# Patient Record
Sex: Female | Born: 1935 | Race: White | Hispanic: No | Smoking: Former smoker
Health system: Southern US, Community
[De-identification: ages and names within clinical notes are randomized; demographics above are authoritative.]

## PROBLEM LIST (undated history)

## (undated) DIAGNOSIS — I10 Essential (primary) hypertension: Secondary | ICD-10-CM

## (undated) DIAGNOSIS — G629 Polyneuropathy, unspecified: Secondary | ICD-10-CM

## (undated) DIAGNOSIS — M1990 Unspecified osteoarthritis, unspecified site: Secondary | ICD-10-CM

## (undated) DIAGNOSIS — E039 Hypothyroidism, unspecified: Secondary | ICD-10-CM

## (undated) DIAGNOSIS — E079 Disorder of thyroid, unspecified: Secondary | ICD-10-CM

## (undated) HISTORY — PX: OTHER SURGICAL HISTORY: SHX169

## (undated) HISTORY — PX: HIP SURGERY: SHX245

## (undated) HISTORY — PX: SHOULDER SURGERY: SHX246

## (undated) HISTORY — PX: WRIST SURGERY: SHX841

---

## 2020-04-01 ENCOUNTER — Emergency Department (HOSPITAL_BASED_OUTPATIENT_CLINIC_OR_DEPARTMENT_OTHER): Payer: Medicare Other

## 2020-04-01 ENCOUNTER — Other Ambulatory Visit: Payer: Self-pay

## 2020-04-01 ENCOUNTER — Inpatient Hospital Stay (HOSPITAL_BASED_OUTPATIENT_CLINIC_OR_DEPARTMENT_OTHER)
Admission: EM | Admit: 2020-04-01 | Discharge: 2020-04-05 | DRG: 439 | Disposition: A | Discharge status: Home Health | Payer: Medicare Other | Attending: Internal Medicine | Admitting: Internal Medicine

## 2020-04-01 ENCOUNTER — Encounter (HOSPITAL_BASED_OUTPATIENT_CLINIC_OR_DEPARTMENT_OTHER): Payer: Self-pay

## 2020-04-01 DIAGNOSIS — R4702 Dysphasia: Secondary | ICD-10-CM | POA: Diagnosis present

## 2020-04-01 DIAGNOSIS — G629 Polyneuropathy, unspecified: Secondary | ICD-10-CM | POA: Diagnosis present

## 2020-04-01 DIAGNOSIS — Z79899 Other long term (current) drug therapy: Secondary | ICD-10-CM

## 2020-04-01 DIAGNOSIS — D649 Anemia, unspecified: Secondary | ICD-10-CM | POA: Diagnosis present

## 2020-04-01 DIAGNOSIS — I160 Hypertensive urgency: Secondary | ICD-10-CM | POA: Diagnosis present

## 2020-04-01 DIAGNOSIS — R4701 Aphasia: Secondary | ICD-10-CM | POA: Diagnosis not present

## 2020-04-01 DIAGNOSIS — E871 Hypo-osmolality and hyponatremia: Secondary | ICD-10-CM | POA: Diagnosis present

## 2020-04-01 DIAGNOSIS — I739 Peripheral vascular disease, unspecified: Secondary | ICD-10-CM | POA: Diagnosis present

## 2020-04-01 DIAGNOSIS — R471 Dysarthria and anarthria: Secondary | ICD-10-CM | POA: Diagnosis not present

## 2020-04-01 DIAGNOSIS — E079 Disorder of thyroid, unspecified: Secondary | ICD-10-CM

## 2020-04-01 DIAGNOSIS — I440 Atrioventricular block, first degree: Secondary | ICD-10-CM | POA: Diagnosis present

## 2020-04-01 DIAGNOSIS — K810 Acute cholecystitis: Secondary | ICD-10-CM | POA: Diagnosis not present

## 2020-04-01 DIAGNOSIS — Z66 Do not resuscitate: Secondary | ICD-10-CM | POA: Diagnosis present

## 2020-04-01 DIAGNOSIS — R29703 NIHSS score 3: Secondary | ICD-10-CM | POA: Diagnosis present

## 2020-04-01 DIAGNOSIS — Z87891 Personal history of nicotine dependence: Secondary | ICD-10-CM

## 2020-04-01 DIAGNOSIS — K859 Acute pancreatitis without necrosis or infection, unspecified: Secondary | ICD-10-CM | POA: Diagnosis not present

## 2020-04-01 DIAGNOSIS — Z20822 Contact with and (suspected) exposure to covid-19: Secondary | ICD-10-CM | POA: Diagnosis present

## 2020-04-01 DIAGNOSIS — Z22322 Carrier or suspected carrier of Methicillin resistant Staphylococcus aureus: Secondary | ICD-10-CM | POA: Diagnosis not present

## 2020-04-01 DIAGNOSIS — H9190 Unspecified hearing loss, unspecified ear: Secondary | ICD-10-CM | POA: Diagnosis present

## 2020-04-01 DIAGNOSIS — I959 Hypotension, unspecified: Secondary | ICD-10-CM | POA: Diagnosis not present

## 2020-04-01 DIAGNOSIS — I10 Essential (primary) hypertension: Secondary | ICD-10-CM | POA: Diagnosis present

## 2020-04-01 DIAGNOSIS — R945 Abnormal results of liver function studies: Secondary | ICD-10-CM | POA: Diagnosis not present

## 2020-04-01 DIAGNOSIS — Z79890 Hormone replacement therapy (postmenopausal): Secondary | ICD-10-CM

## 2020-04-01 DIAGNOSIS — E039 Hypothyroidism, unspecified: Secondary | ICD-10-CM | POA: Diagnosis present

## 2020-04-01 DIAGNOSIS — E785 Hyperlipidemia, unspecified: Secondary | ICD-10-CM | POA: Diagnosis present

## 2020-04-01 DIAGNOSIS — I6389 Other cerebral infarction: Secondary | ICD-10-CM | POA: Diagnosis not present

## 2020-04-01 DIAGNOSIS — R7401 Elevation of levels of liver transaminase levels: Secondary | ICD-10-CM | POA: Diagnosis not present

## 2020-04-01 DIAGNOSIS — K851 Biliary acute pancreatitis without necrosis or infection: Secondary | ICD-10-CM | POA: Diagnosis present

## 2020-04-01 DIAGNOSIS — G459 Transient cerebral ischemic attack, unspecified: Secondary | ICD-10-CM | POA: Diagnosis not present

## 2020-04-01 DIAGNOSIS — I639 Cerebral infarction, unspecified: Secondary | ICD-10-CM | POA: Diagnosis not present

## 2020-04-01 DIAGNOSIS — R1011 Right upper quadrant pain: Secondary | ICD-10-CM

## 2020-04-01 HISTORY — DX: Unspecified osteoarthritis, unspecified site: M19.90

## 2020-04-01 HISTORY — DX: Essential (primary) hypertension: I10

## 2020-04-01 HISTORY — DX: Disorder of thyroid, unspecified: E07.9

## 2020-04-01 HISTORY — DX: Polyneuropathy, unspecified: G62.9

## 2020-04-01 LAB — CBC WITH DIFFERENTIAL/PLATELET
Abs Immature Granulocytes: 0.05 K/uL (ref 0.00–0.07)
Basophils Absolute: 0 K/uL (ref 0.0–0.1)
Basophils Relative: 0 %
Eosinophils Absolute: 0 K/uL (ref 0.0–0.5)
Eosinophils Relative: 0 %
HCT: 36.4 % (ref 36.0–46.0)
Hemoglobin: 11.6 g/dL — ABNORMAL LOW (ref 12.0–15.0)
Immature Granulocytes: 0 %
Lymphocytes Relative: 4 %
Lymphs Abs: 0.5 K/uL — ABNORMAL LOW (ref 0.7–4.0)
MCH: 21.7 pg — ABNORMAL LOW (ref 26.0–34.0)
MCHC: 31.9 g/dL (ref 30.0–36.0)
MCV: 68 fL — ABNORMAL LOW (ref 80.0–100.0)
Monocytes Absolute: 0.6 K/uL (ref 0.1–1.0)
Monocytes Relative: 5 %
Neutro Abs: 10.6 K/uL — ABNORMAL HIGH (ref 1.7–7.7)
Neutrophils Relative %: 91 %
Platelets: 242 K/uL (ref 150–400)
RBC: 5.35 MIL/uL — ABNORMAL HIGH (ref 3.87–5.11)
RDW: 14.6 % (ref 11.5–15.5)
Smear Review: NORMAL
WBC: 11.8 K/uL — ABNORMAL HIGH (ref 4.0–10.5)
nRBC: 0 % (ref 0.0–0.2)

## 2020-04-01 LAB — LIPASE, BLOOD: Lipase: 1575 U/L — ABNORMAL HIGH (ref 11–51)

## 2020-04-01 LAB — COMPREHENSIVE METABOLIC PANEL
ALT: 596 U/L — ABNORMAL HIGH (ref 0–44)
AST: 1342 U/L — ABNORMAL HIGH (ref 15–41)
Albumin: 3.8 g/dL (ref 3.5–5.0)
Alkaline Phosphatase: 188 U/L — ABNORMAL HIGH (ref 38–126)
Anion gap: 10 (ref 5–15)
BUN: 12 mg/dL (ref 8–23)
CO2: 21 mmol/L — ABNORMAL LOW (ref 22–32)
Calcium: 9 mg/dL (ref 8.9–10.3)
Chloride: 92 mmol/L — ABNORMAL LOW (ref 98–111)
Creatinine, Ser: 0.39 mg/dL — ABNORMAL LOW (ref 0.44–1.00)
GFR calc Af Amer: >60 mL/min (ref >60)
GFR calc non Af Amer: >60 mL/min (ref >60)
Glucose, Bld: 141 mg/dL — ABNORMAL HIGH (ref 70–99)
Potassium: 4.3 mmol/L (ref 3.5–5.1)
Sodium: 123 mmol/L — ABNORMAL LOW (ref 135–145)
Total Bilirubin: 1.1 mg/dL (ref 0.3–1.2)
Total Protein: 6.8 g/dL (ref 6.5–8.1)

## 2020-04-01 LAB — RESPIRATORY PANEL BY RT PCR (FLU A&B, COVID)
Influenza A by PCR: NEGATIVE
Influenza B by PCR: NEGATIVE
SARS Coronavirus 2 by RT PCR: NEGATIVE

## 2020-04-01 LAB — TROPONIN I (HIGH SENSITIVITY)
Troponin I (High Sensitivity): 6 ng/L (ref <18)
Troponin I (High Sensitivity): 7 ng/L

## 2020-04-01 MED ORDER — LACTATED RINGERS IV BOLUS
1000.0000 mL | Freq: Once | INTRAVENOUS | Status: AC
  Administered 2020-04-01: 1000 mL via INTRAVENOUS

## 2020-04-01 MED ORDER — FENTANYL CITRATE (PF) 100 MCG/2ML IJ SOLN
12.5000 ug | Freq: Once | INTRAMUSCULAR | Status: AC
  Administered 2020-04-01: 12.5 ug via INTRAVENOUS
  Filled 2020-04-01: qty 2

## 2020-04-01 MED ORDER — LIDOCAINE VISCOUS HCL 2 % MT SOLN: 15.0000 mL | Freq: Once | OROMUCOSAL | Status: DC

## 2020-04-01 MED ORDER — ALUM & MAG HYDROXIDE-SIMETH 200-200-20 MG/5ML PO SUSP: 30.0000 mL | Freq: Once | ORAL | Status: DC

## 2020-04-01 MED ORDER — ONDANSETRON HCL 4 MG/2ML IJ SOLN
4.0000 mg | Freq: Once | INTRAMUSCULAR | Status: AC
  Administered 2020-04-01: 4 mg via INTRAVENOUS
  Filled 2020-04-01: qty 2

## 2020-04-01 MED ORDER — FENTANYL CITRATE (PF) 100 MCG/2ML IJ SOLN
25.0000 ug | Freq: Once | INTRAMUSCULAR | Status: AC
  Administered 2020-04-01: 25 ug via INTRAVENOUS
  Filled 2020-04-01: qty 2

## 2020-04-01 MED ORDER — FENTANYL CITRATE (PF) 100 MCG/2ML IJ SOLN: 50.0000 ug | Freq: Once | INTRAMUSCULAR | Status: DC

## 2020-04-01 MED ORDER — IOHEXOL 300 MG/ML  SOLN
80.0000 mL | Freq: Once | INTRAMUSCULAR | Status: AC | PRN
  Administered 2020-04-01: 80 mL via INTRAVENOUS

## 2020-04-01 MED ORDER — SODIUM CHLORIDE 0.9 % IV SOLN
1.0000 g | Freq: Once | INTRAVENOUS | Status: AC
  Administered 2020-04-01: 1 g via INTRAVENOUS
  Filled 2020-04-01: qty 10

## 2020-04-01 MED ORDER — METRONIDAZOLE IN NACL 5-0.79 MG/ML-% IV SOLN
500.0000 mg | Freq: Once | INTRAVENOUS | Status: AC
  Administered 2020-04-01: 500 mg via INTRAVENOUS
  Filled 2020-04-01: qty 100

## 2020-04-01 NOTE — ED Notes (Signed)
Dr. Plunkett ED Provider at bedside. 

## 2020-04-01 NOTE — ED Provider Notes (Signed)
[facility] EMERGENCY DEPARTMENT Provider Note   CSN: [id number] Arrival date & time: 04/01/20  1845     History Chief Complaint  Patient presents with  . Chest Pain    Meghan Kim is a 86 y.o. female.  Patient is a 86 year old female with a history of hypertension, thyroid disease who is presenting today with chest and epigastric pain.  Patient reports she had lunch around noon and shortly after lunch she started experiencing discomfort in her chest.  She tried to take some Pepto-Bismol but it did not help.  Prior to coming to the emergency room she did have some nausea but no vomiting.  She reports the pain has now moved into her upper abdomen.  She has no shortness of breath, diarrhea or urinary complaints.  She has had no fever or cough.  No prior history of cardiac issues except for hypertension.  She has had no prior abdominal surgeries.  She reports the pain is a 7 out of 10.  It does not radiate.  The history is provided by the patient.  Chest Pain Pain location:  Substernal area and epigastric Pain quality: aching, dull and throbbing   Pain radiates to:  Does not radiate Pain severity:  Severe Onset quality:  Gradual Duration:  7 hours Timing:  Constant Progression:  Worsening Chronicity:  New Context comment:  Started 30 to 45 minutes after eating lunch Relieved by:  Nothing Worsened by:  Nothing Ineffective treatments:  Antacids Associated symptoms: abdominal pain and nausea   Associated symptoms: no cough, no fever, no heartburn, no palpitations and no vomiting        Past Medical History:  Diagnosis Date  . Arthritis   . Hypertension   . Neuropathy   . Thyroid disease     There are no problems to display for this patient.   Past Surgical History:  Procedure Laterality Date  . arm surgery    . HIP SURGERY    . SHOULDER SURGERY    . WRIST SURGERY       OB History   No obstetric history on file.     No family history on  file.  Social History   Tobacco Use  . Smoking status: Former Research scientist (life sciences)  . Smokeless tobacco: Never Used  Substance Use Topics  . Alcohol use: Yes    Comment: occ  . Drug use: Never    Home Medications Prior to Admission medications   Medication Sig Start Date End Date Taking? Authorizing Provider  alendronate (FOSAMAX) 70 MG tablet Take by mouth. 01/12/20  Yes [provider]  dupilumab (DUPIXENT) 300 MG/2ML prefilled syringe Inject into the skin. 02/05/20  Yes [provider]  fluticasone (FLONASE) 50 MCG/ACT nasal spray USE 2 SPRAYS IN EACH NOSTRIL TWICE DAILY 12/15/19  Yes [provider]  furosemide (LASIX) 20 MG tablet Take by mouth. 11/11/18  Yes [provider]  gabapentin (NEURONTIN) 300 MG capsule Take by mouth. 02/08/20 04/08/20 Yes [provider]  levothyroxine (SYNTHROID) 50 MCG tablet TAKE 1 TABLET BY MOUTH EVERY DAY AT 6:00AM 07/08/17  Yes [provider]  mirabegron ER (MYRBETRIQ) 25 MG TB24 tablet Take by mouth. 03/20/20  Yes [provider]  rOPINIRole (REQUIP) 0.25 MG tablet Take by mouth. 02/15/20  Yes [provider]  temazepam (RESTORIL) 15 MG capsule Take by mouth. 11/11/19  Yes [provider]  valsartan (DIOVAN) 80 MG tablet Take by mouth. 03/20/20  Yes [provider]  fexofenadine Delma Freeze)  180 MG tablet Take by mouth.    [provider]  lisinopril (ZESTRIL) 10 MG tablet Take 10 mg by mouth daily. 11/02/19   [provider]  omeprazole (PRILOSEC) 20 MG capsule Take by mouth.    [provider]  STOOL SOFTENER 100 MG capsule Take 100 mg by mouth daily. 03/09/20   [provider]    Allergies    Patient has no known allergies.  Review of Systems   Review of Systems  Constitutional: Negative for fever.  Respiratory: Negative for cough.   Cardiovascular: Positive for chest pain. Negative for palpitations.  Gastrointestinal: Positive for  abdominal pain and nausea. Negative for heartburn and vomiting.    Physical Exam Updated Vital Signs Ht 5' 2"  (1.575 m)   Wt 54.4 kg   BMI 21.95 kg/m   Physical Exam Vitals and nursing note reviewed.  Constitutional:      General: She is not in acute distress.    Appearance: She is well-developed and normal weight.  HENT:     Head: Normocephalic and atraumatic.  Eyes:     Pupils: Pupils are equal, round, and reactive to light.  Cardiovascular:     Rate and Rhythm: Regular rhythm. Tachycardia present.     Heart sounds: Normal heart sounds. No murmur. No friction rub.  Pulmonary:     Effort: Pulmonary effort is normal.     Breath sounds: Normal breath sounds. No wheezing or rales.  Abdominal:     General: Bowel sounds are normal. There is no distension.     Palpations: Abdomen is soft.     Tenderness: There is abdominal tenderness in the right upper quadrant. There is guarding. There is no rebound. Positive signs include Murphy's sign.  Musculoskeletal:        General: No tenderness. Normal range of motion.     Comments: No edema  Skin:    General: Skin is warm and dry.     Findings: No rash.  Neurological:     General: No focal deficit present.     Mental Status: She is alert and oriented to person, place, and time. Mental status is at baseline.     Cranial Nerves: No cranial nerve deficit.  Psychiatric:        Mood and Affect: Mood normal.        Behavior: Behavior normal.        Thought Content: Thought content normal.     ED Results / Procedures / Treatments   Labs (all labs ordered are listed, but only abnormal results are displayed) Labs Reviewed  CBC WITH DIFFERENTIAL/PLATELET - Abnormal; Notable for the following components:      Result Value   WBC 11.8 (*)    RBC 5.35 (*)    Hemoglobin 11.6 (*)    MCV 68.0 (*)    MCH 21.7 (*)    Neutro Abs 10.6 (*)    Lymphs Abs 0.5 (*)    All other components within normal limits  COMPREHENSIVE METABOLIC PANEL -  Abnormal; Notable for the following components:   Sodium 123 (*)    Chloride 92 (*)    CO2 21 (*)    Glucose, Bld 141 (*)    Creatinine, Ser 0.39 (*)    AST 1,342 (*)    ALT 596 (*)    Alkaline Phosphatase 188 (*)    All other components within normal limits  LIPASE, BLOOD - Abnormal; Notable for the following components:   Lipase 1,575 (*)  All other components within normal limits  RESPIRATORY PANEL BY RT PCR (FLU A&B, COVID)  TROPONIN I (HIGH SENSITIVITY)  TROPONIN I (HIGH SENSITIVITY)    EKG EKG Interpretation  Date/Time:  Saturday Apr 01 2020 18:57:30 EDT Ventricular Rate:  107 PR Interval:    QRS Duration: 76 QT Interval:  315 QTC Calculation: 421 R Axis:   -52 Text Interpretation: Sinus tachycardia Prolonged PR interval Inferior infarct, old Anterior infarct, old No previous tracing Confirmed by Blanchie Dessert [phone]) on 04/01/2020 7:17:04 PM   Radiology CT ABDOMEN PELVIS W CONTRAST  Result Date: 04/01/2020 CLINICAL DATA:  Acute abdominal pain. EXAM: CT ABDOMEN AND PELVIS WITH CONTRAST TECHNIQUE: Multidetector CT imaging of the abdomen and pelvis was performed using the standard protocol following bolus administration of intravenous contrast. CONTRAST:  63m OMNIPAQUE IOHEXOL 300 MG/ML  SOLN COMPARISON:  February 11, 2017. FINDINGS: Lower chest: The lung bases are clear. The heart size is normal. Hepatobiliary: The liver is normal. The gallbladder is significantly distended. There are pericholecystic inflammatory changes.There is significant intrahepatic and extrahepatic biliary ductal dilatation. The common bile duct measures up to approximately 1.4 cm in diameter. Pancreas: The pancreas is atrophic. There are mild inflammatory changes near the head of the pancreas. Spleen: Unremarkable. Adrenals/Urinary Tract: --Adrenal glands: Unremarkable. --Right kidney/ureter: No hydronephrosis or radiopaque kidney stones. --Left kidney/ureter: No hydronephrosis or radiopaque kidney  stones. --Urinary bladder: The urinary bladder is significantly distended. Stomach/Bowel: --Stomach/Duodenum: No hiatal hernia or other gastric abnormality. Normal duodenal course and caliber. --Small bowel: There are inflammatory changes surrounding the third and fourth portions of the duodenum. Duodenal diverticula are noted. --Colon: Rectosigmoid diverticulosis without acute inflammation. --Appendix: Normal. Vascular/Lymphatic: Atherosclerotic calcification is present within the non-aneurysmal abdominal aorta, without hemodynamically significant stenosis. Heavy calcifications are noted in the mid to distal aorta likely resulting in some mild to moderate stenosis. --No retroperitoneal lymphadenopathy. --No mesenteric lymphadenopathy. --No pelvic or inguinal lymphadenopathy. Reproductive: There prominent pelvic veins bilaterally. Other: No ascites or free air. The abdominal wall is normal. Musculoskeletal. There is an age-indeterminate compression fracture of the L4 vertebral body, new since 2018. Multilevel degenerative changes are noted throughout the lumbar spine. There is an old fracture of the inferior pubic ramus on the right with evidence for nonunion. There are old fractures involving the right superior pubic ramus and anterior column of the right acetabulum. Bilateral sacral fractures are noted status post bilateral sacral fixation. The patient is status post prior intramedullary nail placement through the left femur. There are end-stage degenerative changes of the left hip. There are moderate degenerative changes of the right hip. IMPRESSION: 1. Significantly distended gallbladder with pericholecystic inflammatory changes and significant intrahepatic and extrahepatic biliary ductal dilatation. Findings are concerning for acute cholecystitis or choledocholithiasis in the appropriate clinical setting. Consider further evaluation with ultrasound and MRCP as clinically indicated. 2. Inflammatory changes  surrounding the head of the pancreas and duodenum. Findings may be secondary to acute interstitial pancreatitis or duodenitis. Correlation with laboratory studies is recommended. 3. Rectosigmoid diverticulosis without acute inflammation. 4. Age-indeterminate compression fracture of the L4 vertebral body, new since 2018. 5. Old pelvic fractures as above. Aortic Atherosclerosis (ICD10-I70.0). Electronically Signed   By: CConstance HolsterM.D.   On: 04/01/2020 22:50   DG Chest Port 1 View  Result Date: 04/01/2020 CLINICAL DATA:  Chest discomfort since lunch, previous tobacco abuse EXAM: PORTABLE CHEST 1 VIEW COMPARISON:  08/19/2018 FINDINGS: Single frontal view of the chest demonstrates an unremarkable cardiac silhouette. Background interstitial prominence consistent with  history of tobacco abuse. No airspace disease, effusion, or pneumothorax. No acute bony abnormalities. IMPRESSION: 1. No acute intrathoracic process. Electronically Signed   By: Randa Ngo M.D.   On: 04/01/2020 20:04   US Abdomen Limited RUQ  Result Date: 04/01/2020 CLINICAL DATA:  Postprandial abdominal pain. EXAM: ULTRASOUND ABDOMEN LIMITED RIGHT UPPER QUADRANT COMPARISON:  None. FINDINGS: Gallbladder: No gallstones or wall thickening visualized (2.2 mm). No sonographic Murphy sign noted by sonographer. Common bile duct: Diameter: 6.5 mm Liver: No focal lesion identified. Within normal limits in parenchymal echogenicity. Portal vein is patent on color Doppler imaging with normal direction of blood flow towards the liver. Other: It should be noted that the study is limited secondary to overlying bowel gas. IMPRESSION: 1. Limited study secondary to overlying bowel gas. 2. Otherwise unremarkable right upper quadrant ultrasound. Electronically Signed   By: Virgina Norfolk M.D.   On: 04/01/2020 21:01    Procedures Procedures (including critical care time)  Medications Ordered in ED Medications  ondansetron (ZOFRAN) injection 4 mg  (has no administration in time range)  fentaNYL (SUBLIMAZE) injection 25 mcg (has no administration in time range)    ED Course  I have reviewed the triage vital signs and the nursing notes.  Pertinent labs & imaging results that were available during my care of the patient were reviewed by me and considered in my medical decision making (see chart for details).    MDM Rules/Calculators/A&P                      Elderly female presenting today with sternal and upper abdominal pain.  Patient reports symptoms started after noon today after she ate lunch.  She has had some burping and nausea but denies any cough or shortness of breath.  No respiratory symptoms, EKG without acute findings.  Possibility for ACS but low suspicion for pneumonia or PE.  High concern for cholecystitis as patient has significant right upper quadrant tenderness versus perforated viscus or other abdominal pathology such as acute hepatitis or pancreatitis.  Patient given pain control, chest x-ray, right upper quadrant ultrasound and labs are pending.  Patient is hypertensive here and satting normally on room air.  Hypertension may be related to pain and will reevaluate after patient's pain has improved.  10:53 PM Patient's LFTs are elevated today with an AST of 1300 and ALT of 596 with normal T bili and elevated alk phos of 188.  Lipase is 1500 today, troponins are negative and CBC with minimal leukocytosis of 11,000.  Patient's right upper quadrant ultrasound is limited secondary to overlying bowel gas but other wise report an unremarkable right upper quadrant.  Chest x-ray without acute findings.  Spoke with [facility] who recommended doing a CT for further evaluation.  On repeat evaluation patient reports that her pain initially was very controlled but is now starting to come back.  CT showed significantly distended gallbladder with pericholecystic inflammatory changes and significant intrahepatic and extrahepatic biliary ductal  dilation.  Findings were consistent for cholecystitis or choledocholithiasis despite right upper quadrant ultrasound being read as normal.  There were also inflammatory changes surrounding the head of the pancreas and duodenum.  They report patient may need an MRCP.  MDM Number of Diagnoses or Management Options Acute pancreatitis, unspecified complication status, unspecified pancreatitis type: new, needed workup RUQ pain: new, needed workup Transaminitis: new, needed workup   Amount and/or Complexity of Data Reviewed Clinical lab tests: ordered and reviewed Tests in the radiology  section of CPT: reviewed and ordered Tests in the medicine section of CPT: ordered and reviewed Decide to obtain previous medical records or to obtain history from someone other than the patient: yes Obtain history from someone other than the patient: no Review and summarize past medical records: yes Discuss the patient with other providers: yes Independent visualization of images, tracings, or specimens: yes  Risk of Complications, Morbidity, and/or Mortality Presenting problems: high Diagnostic procedures: low Management options: low  Patient Progress Patient progress: improved    Final Clinical Impression(s) / ED Diagnoses Final diagnoses:  RUQ pain  Acute pancreatitis, unspecified complication status, unspecified pancreatitis type  Transaminitis    Rx / DC Orders ED Discharge Orders    None       Blanchie Dessert, MD 04/01/20 2257

## 2020-04-01 NOTE — ED Notes (Addendum)
[facility] notified - patient ready for transport Meghan Kim)

## 2020-04-01 NOTE — ED Triage Notes (Signed)
Pt c/o CP that started just after lunch. Pt describes pain as achy. Pt denies ShOB or nausea. Pt states she thinks it "feels like indigestion." Pt state she was burping.

## 2020-04-02 ENCOUNTER — Encounter (HOSPITAL_COMMUNITY): Payer: Self-pay | Admitting: Family Medicine

## 2020-04-02 ENCOUNTER — Inpatient Hospital Stay (HOSPITAL_COMMUNITY): Payer: Medicare Other

## 2020-04-02 DIAGNOSIS — E871 Hypo-osmolality and hyponatremia: Secondary | ICD-10-CM | POA: Diagnosis present

## 2020-04-02 DIAGNOSIS — K859 Acute pancreatitis without necrosis or infection, unspecified: Secondary | ICD-10-CM

## 2020-04-02 DIAGNOSIS — K810 Acute cholecystitis: Secondary | ICD-10-CM

## 2020-04-02 DIAGNOSIS — I10 Essential (primary) hypertension: Secondary | ICD-10-CM | POA: Diagnosis present

## 2020-04-02 DIAGNOSIS — E079 Disorder of thyroid, unspecified: Secondary | ICD-10-CM | POA: Diagnosis present

## 2020-04-02 DIAGNOSIS — I639 Cerebral infarction, unspecified: Secondary | ICD-10-CM

## 2020-04-02 DIAGNOSIS — R7401 Elevation of levels of liver transaminase levels: Secondary | ICD-10-CM

## 2020-04-02 LAB — CBC WITH DIFFERENTIAL/PLATELET
Abs Immature Granulocytes: 0.04 10*3/uL (ref 0.00–0.07)
Basophils Absolute: 0 10*3/uL (ref 0.0–0.1)
Basophils Relative: 0 %
Eosinophils Absolute: 0.1 10*3/uL (ref 0.0–0.5)
Eosinophils Relative: 1 %
HCT: 34.5 % — ABNORMAL LOW (ref 36.0–46.0)
Hemoglobin: 11.1 g/dL — ABNORMAL LOW (ref 12.0–15.0)
Immature Granulocytes: 0 %
Lymphocytes Relative: 7 %
Lymphs Abs: 0.7 10*3/uL (ref 0.7–4.0)
MCH: 22 pg — ABNORMAL LOW (ref 26.0–34.0)
MCHC: 32.2 g/dL (ref 30.0–36.0)
MCV: 68.3 fL — ABNORMAL LOW (ref 80.0–100.0)
Monocytes Absolute: 0.8 10*3/uL (ref 0.1–1.0)
Monocytes Relative: 8 %
Neutro Abs: 8.9 10*3/uL — ABNORMAL HIGH (ref 1.7–7.7)
Neutrophils Relative %: 84 %
Platelets: 225 10*3/uL (ref 150–400)
RBC: 5.05 MIL/uL (ref 3.87–5.11)
RDW: 14.5 % (ref 11.5–15.5)
WBC: 10.5 10*3/uL (ref 4.0–10.5)
nRBC: 0 % (ref 0.0–0.2)

## 2020-04-02 LAB — COMPREHENSIVE METABOLIC PANEL
ALT: 565 U/L — ABNORMAL HIGH (ref 0–44)
AST: 736 U/L — ABNORMAL HIGH (ref 15–41)
Albumin: 3.2 g/dL — ABNORMAL LOW (ref 3.5–5.0)
Alkaline Phosphatase: 160 U/L — ABNORMAL HIGH (ref 38–126)
Anion gap: 10 (ref 5–15)
BUN: 7 mg/dL — ABNORMAL LOW (ref 8–23)
CO2: 23 mmol/L (ref 22–32)
Calcium: 8.8 mg/dL — ABNORMAL LOW (ref 8.9–10.3)
Chloride: 96 mmol/L — ABNORMAL LOW (ref 98–111)
Creatinine, Ser: 0.42 mg/dL — ABNORMAL LOW (ref 0.44–1.00)
GFR calc Af Amer: >60 mL/min (ref >60)
GFR calc non Af Amer: >60 mL/min (ref >60)
Glucose, Bld: 110 mg/dL — ABNORMAL HIGH (ref 70–99)
Potassium: 4.2 mmol/L (ref 3.5–5.1)
Sodium: 129 mmol/L — ABNORMAL LOW (ref 135–145)
Total Bilirubin: 1.1 mg/dL (ref 0.3–1.2)
Total Protein: 6 g/dL — ABNORMAL LOW (ref 6.5–8.1)

## 2020-04-02 LAB — LIPASE, BLOOD: Lipase: 896 U/L — ABNORMAL HIGH (ref 11–51)

## 2020-04-02 LAB — MRSA PCR SCREENING: MRSA by PCR: POSITIVE — AB

## 2020-04-02 LAB — GLUCOSE, CAPILLARY: Glucose-Capillary: 83 mg/dL (ref 70–99)

## 2020-04-02 MED ORDER — GADOBUTROL 1 MMOL/ML IV SOLN
5.5000 mL | Freq: Once | INTRAVENOUS | Status: AC | PRN
  Administered 2020-04-02: 5.5 mL via INTRAVENOUS

## 2020-04-02 MED ORDER — ALTEPLASE (STROKE) FULL DOSE INFUSION
0.9000 mg/kg | Freq: Once | INTRAVENOUS | Status: AC
  Administered 2020-04-02: 48.1 mg via INTRAVENOUS
  Filled 2020-04-02: qty 100

## 2020-04-02 MED ORDER — SODIUM CHLORIDE 0.9 % IV SOLN: INTRAVENOUS | Status: AC

## 2020-04-02 MED ORDER — ACETAMINOPHEN 325 MG PO TABS
650.0000 mg | ORAL_TABLET | Freq: Four times a day (QID) | ORAL | Status: DC | PRN
  Administered 2020-04-02 – 2020-04-04 (×6): 650 mg via ORAL
  Filled 2020-04-02 (×6): qty 2

## 2020-04-02 MED ORDER — FENTANYL CITRATE (PF) 100 MCG/2ML IJ SOLN: 12.5000 ug | INTRAMUSCULAR | Status: DC | PRN

## 2020-04-02 MED ORDER — CLEVIDIPINE BUTYRATE 0.5 MG/ML IV EMUL
0.0000 mg/h | INTRAVENOUS | Status: DC
  Administered 2020-04-02: 1 mg/h via INTRAVENOUS
  Filled 2020-04-02: qty 50

## 2020-04-02 MED ORDER — ONDANSETRON HCL 4 MG/2ML IJ SOLN
4.0000 mg | Freq: Four times a day (QID) | INTRAMUSCULAR | Status: DC | PRN
  Administered 2020-04-02: 4 mg via INTRAVENOUS
  Filled 2020-04-02: qty 2

## 2020-04-02 MED ORDER — SODIUM CHLORIDE 0.9% FLUSH
3.0000 mL | Freq: Two times a day (BID) | INTRAVENOUS | Status: DC
  Administered 2020-04-02 – 2020-04-05 (×7): 3 mL via INTRAVENOUS

## 2020-04-02 MED ORDER — FENTANYL CITRATE (PF) 100 MCG/2ML IJ SOLN
INTRAMUSCULAR | Status: AC
  Administered 2020-04-02: 25 ug via INTRAVENOUS
  Filled 2020-04-02: qty 2

## 2020-04-02 MED ORDER — CHLORHEXIDINE GLUCONATE CLOTH 2 % EX PADS
6.0000 | MEDICATED_PAD | Freq: Every day | CUTANEOUS | Status: DC
  Administered 2020-04-02: 6 via TOPICAL

## 2020-04-02 MED ORDER — ACETAMINOPHEN 650 MG RE SUPP: 650.0000 mg | Freq: Four times a day (QID) | RECTAL | Status: DC | PRN

## 2020-04-02 MED ORDER — PIPERACILLIN-TAZOBACTAM 3.375 G IVPB
3.3750 g | Freq: Three times a day (TID) | INTRAVENOUS | Status: DC
  Administered 2020-04-02 – 2020-04-03 (×4): 3.375 g via INTRAVENOUS
  Filled 2020-04-02 (×5): qty 50

## 2020-04-02 MED ORDER — SODIUM CHLORIDE 0.9 % IV SOLN
50.0000 mL | Freq: Once | INTRAVENOUS | Status: AC
  Administered 2020-04-02: 50 mL via INTRAVENOUS

## 2020-04-02 MED ORDER — FENTANYL CITRATE (PF) 100 MCG/2ML IJ SOLN: 25.0000 ug | Freq: Once | INTRAMUSCULAR | Status: AC

## 2020-04-02 MED ORDER — LABETALOL HCL 5 MG/ML IV SOLN: 10.0000 mg | INTRAVENOUS | Status: DC | PRN

## 2020-04-02 NOTE — Significant Event (Signed)
Rapid Response Event Note  Overview: Arrival Time: 0945 Event Type: Neurologic New onset "word finding" and right facial droop.  Initial Focused Assessment: Pt lying in bed, alert. PERRLA, EOMI. Face is symmetrical, no droop upon my assessment. Pt is able to tell me her age, but not the month. Pt is HOH and hearing aids are not charged- creating some challenge to exam. Pt follows commands, but takes multiple prompts- likely due to hearing. Pt is able to identify some objects correctly but states "umm" repeatedly and gets frustrated when she is unable to identify a phone. Speech sounds mildly slurred, heavy. Pt tends to the left side more readily, but does cross midline and uses both sides when prompted. NIH 4. Code stroke activated for expressive aphasia.   Interventions: -LKW: 0730 -Code stroke called at [phone] -Verbal consent received from patient's son via telephone for tPA administration by myself and Dr. Otelia Limes -tPA started at 1043 - 5.4mg  bolus over 1 minute followed by 48.1mg  for a total dose of 53.5mg  over 1 hour, administration verified by Joaquim Lai., RN.   Plan of Care (if not transferred): -Transfer to Neuro ICU for tPA infusion  Event Summary: Name of Physician Notified: Dr. Jonathon Bellows at [phone] Outcome: Transferred (Comment)(Transferred to 4N ICU) Event End Time: 1115  Jennye Moccasin

## 2020-04-02 NOTE — Progress Notes (Signed)
RN entered pt room to perform mNIHSS exam with pt. Pt states she would not like to participate at this time, pt states "not while I'm eating". RN discussed the need to continue to perform serial mNIHSS exams and that the timing of the exam is critical to provide adequate care. Pt is dismissive of RN and adamantly refuses to participate at this time. RN will leave pt room and request for assistance from another RN on the unit.

## 2020-04-02 NOTE — Progress Notes (Signed)
Pt complains of extreme hunger. Pt has passed [employer] Screen and would like a meal tray to be brought to her room immediately.Pt denies feeling nauseous. RN will facilitate a meal order to be placed at this time.

## 2020-04-02 NOTE — H&P (Addendum)
History and Physical    Dariel Pellecchia OEU:[id number] DOB: 1926-02-25 DOA: 04/01/2020  PCP: [facility]   Patient coming from: Home  Chief Complaint: Chest/upper abdominal pain, nausea  HPI: Meghan Kim is a 86 y.o. female with medical history significant for hypertension, hypothyroidism, arthritis, and neuropathy, now presenting to the emergency department with acute onset of pain in her lower chest and upper abdomen along with nausea.  Patient reports that she typically enjoys good health and was in her usual state until right after eating lunch when she developed discomfort in her chest which she describes as indigestion.  She did not experience any relief with Pepto-Bismol and the discomfort became more intense.  Pain began to localize more to the upper abdomen and she was experiencing nausea but no vomiting.  She did not notice any fevers or chills.  She has never experienced this previously.  Denies any cough or shortness of breath.  [facility] ED Course: Upon arrival to the ED, patient is found to be slightly tachycardic and hypertensive to 200/90.  EKG features sinus tachycardia with rate 107 and first-degree AV nodal block.  Chest x-ray is negative for acute cardiopulmonary disease.  Troponin is normal x2.  Chemistry panel notable for sodium 123, AST 1342, ALT 596, and lipase 1575.  CBC with leukocytosis to 18,800.  Covid and influenza PCR were negative.  Right upper quadrant ultrasound with CBD diameter of 6.5 and otherwise unremarkable though limited by overlying bowel gas.  CT the abdomen pelvis reveals significantly distended gallbladder with pericholecystic inflammatory changes and significant intra and extrahepatic biliary ductal dilatation concerning for acute cholecystitis or choledocholithiasis.  Also noted on CT is inflammatory changes about the head of the pancreas.  Patient was given a liter of lactated Ringer's, Zofran, Rocephin, Flagyl, and multiple doses of IV  fentanyl in the ED.  Review of Systems:  All other systems reviewed and apart from HPI, are negative.  Past Medical History:  Diagnosis Date  . Arthritis   . Hypertension   . Neuropathy   . Thyroid disease     Past Surgical History:  Procedure Laterality Date  . arm surgery    . HIP SURGERY    . SHOULDER SURGERY    . WRIST SURGERY       reports that she has quit smoking. She has never used smokeless tobacco. She reports current alcohol use. She reports that she does not use drugs.  No Known Allergies  History reviewed. No pertinent family history.   Prior to Admission medications   Medication Sig Start Date End Date Taking? Authorizing Provider  alendronate (FOSAMAX) 70 MG tablet Take by mouth. 01/12/20  Yes [provider]  dupilumab (DUPIXENT) 300 MG/2ML prefilled syringe Inject into the skin. 02/05/20  Yes [provider]  fluticasone (FLONASE) 50 MCG/ACT nasal spray USE 2 SPRAYS IN EACH NOSTRIL TWICE DAILY 12/15/19  Yes [provider]  furosemide (LASIX) 20 MG tablet Take by mouth. 11/11/18  Yes [provider]  gabapentin (NEURONTIN) 300 MG capsule Take by mouth. 02/08/20 04/08/20 Yes [provider]  levothyroxine (SYNTHROID) 50 MCG tablet TAKE 1 TABLET BY MOUTH EVERY DAY AT 6:00AM 07/08/17  Yes [provider]  mirabegron ER (MYRBETRIQ) 25 MG TB24 tablet Take by mouth. 03/20/20  Yes [provider]  rOPINIRole (REQUIP) 0.25 MG tablet Take by mouth. 02/15/20  Yes [provider]  temazepam (RESTORIL) 15 MG capsule Take by mouth. 11/11/19  Yes [provider]  valsartan (  DIOVAN) 80 MG tablet Take by mouth. 03/20/20  Yes [provider]  fexofenadine (ALLEGRA) 180 MG tablet Take by mouth.    [provider]  lisinopril (ZESTRIL) 10 MG tablet Take 10 mg by mouth daily. 11/02/19   [provider]  omeprazole (PRILOSEC) 20 MG capsule Take by mouth.    [provider]    STOOL SOFTENER 100 MG capsule Take 100 mg by mouth daily. 03/09/20   [provider]    Physical Exam: Vitals:   04/02/20 0012 04/02/20 0047 04/02/20 0100 04/02/20 0227  BP:  (!) 166/76 (!) 159/71 (!) 156/73  Pulse: (!) 101 100 98 92  Resp: 17 (!) 24 16 20   Temp:    99.3 F (37.4 C)  SpO2: 95% 98% 94% 100%  Weight:      Height:        Constitutional: NAD, calm  Eyes: PERTLA, lids and conjunctivae normal ENMT: Mucous membranes are moist. Posterior pharynx clear of any exudate or lesions.   Neck: normal, supple, no masses, no thyromegaly Respiratory:  no wheezing, no crackles. No accessory muscle use.  Cardiovascular: S1 & S2 heard, regular rate and rhythm. No extremity edema.   Abdomen: Tender in epigastrium without rebound pain or guarding. Bowel sounds active.  Musculoskeletal: no clubbing / cyanosis. No joint deformity upper and lower extremities.   Skin: no significant rashes, lesions, ulcers. Warm, dry, well-perfused. Neurologic: No facial asymmetry, gross hearing deficit. Sensation intact. Strength 5/5 in all 4 limbs.  Psychiatric: Alert and oriented to person, place, and situation. Very pleasant and cooperative.    Labs and Imaging on Admission: I have personally reviewed following labs and imaging studies  CBC: Recent Labs  Lab 04/01/20 1923  WBC 11.8*  NEUTROABS 10.6*  HGB 11.6*  HCT 36.4  MCV 68.0*  PLT 315   Basic Metabolic Panel: Recent Labs  Lab 04/01/20 1923  NA 123*  K 4.3  CL 92*  CO2 21*  GLUCOSE 141*  BUN 12  CREATININE 0.39*  CALCIUM 9.0   GFR: Estimated Creatinine Clearance: 36.2 mL/min (A) (by C-G formula based on SCr of 0.39 mg/dL (L)). Liver Function Tests: Recent Labs  Lab 04/01/20 1923  AST 1,342*  ALT 596*  ALKPHOS 188*  BILITOT 1.1  PROT 6.8  ALBUMIN 3.8   Recent Labs  Lab 04/01/20 1923  LIPASE 1,575*   No results for input(s): AMMONIA in the last 168 hours. Coagulation Profile: No results for input(s):  INR, PROTIME in the last 168 hours. Cardiac Enzymes: No results for input(s): CKTOTAL, CKMB, CKMBINDEX, TROPONINI in the last 168 hours. BNP (last 3 results) No results for input(s): PROBNP in the last 8760 hours. HbA1C: No results for input(s): HGBA1C in the last 72 hours. CBG: No results for input(s): GLUCAP in the last 168 hours. Lipid Profile: No results for input(s): CHOL, HDL, LDLCALC, TRIG, CHOLHDL, LDLDIRECT in the last 72 hours. Thyroid Function Tests: No results for input(s): TSH, T4TOTAL, FREET4, T3FREE, THYROIDAB in the last 72 hours. Anemia Panel: No results for input(s): VITAMINB12, FOLATE, FERRITIN, TIBC, IRON, RETICCTPCT in the last 72 hours. Urine analysis: No results found for: COLORURINE, APPEARANCEUR, LABSPEC, PHURINE, GLUCOSEU, HGBUR, BILIRUBINUR, KETONESUR, PROTEINUR, UROBILINOGEN, NITRITE, LEUKOCYTESUR Sepsis Labs: @LABRCNTIP (procalcitonin:4,lacticidven:4) ) Recent Results (from the past 240 hour(s))  Respiratory Panel by RT PCR (Flu A&B, Covid) - Nasopharyngeal Swab     Status: None   Collection Time: 04/01/20 10:08 PM   Specimen: Nasopharyngeal Swab  Result Value Ref Range Status  SARS Coronavirus 2 by RT PCR NEGATIVE NEGATIVE Final    Comment: (NOTE) SARS-CoV-2 target nucleic acids are NOT DETECTED. The SARS-CoV-2 RNA is generally detectable in upper respiratoy specimens during the acute phase of infection. The lowest concentration of SARS-CoV-2 viral copies this assay can detect is 131 copies/mL. A negative result does not preclude SARS-Cov-2 infection and should not be used as the sole basis for treatment or other patient management decisions. A negative result may occur with  improper specimen collection/handling, submission of specimen other than nasopharyngeal swab, presence of viral mutation(s) within the areas targeted by this assay, and inadequate number of viral copies (<131 copies/mL). A negative result must be combined with  clinical observations, patient history, and epidemiological information. The expected result is Negative. Fact Sheet for Patients:  PinkCheek.be Fact Sheet for Healthcare Providers:  GravelBags.it This test is not yet ap proved or cleared by the [place] FDA and  has been authorized for detection and/or diagnosis of SARS-CoV-2 by FDA under an Emergency Use Authorization (EUA). This EUA will remain  in effect (meaning this test can be used) for the duration of the COVID-19 declaration under Section 564(b)(1) of the Act, 21 U.S.C. section 360bbb-3(b)(1), unless the authorization is terminated or revoked sooner.    Influenza A by PCR NEGATIVE NEGATIVE Final   Influenza B by PCR NEGATIVE NEGATIVE Final    Comment: (NOTE) The Xpert Xpress SARS-CoV-2/FLU/RSV assay is intended as an aid in  the diagnosis of influenza from Nasopharyngeal swab specimens and  should not be used as a sole basis for treatment. Nasal washings and  aspirates are unacceptable for Xpert Xpress SARS-CoV-2/FLU/RSV  testing. Fact Sheet for Patients: PinkCheek.be Fact Sheet for Healthcare Providers: GravelBags.it This test is not yet approved or cleared by the [place] FDA and  has been authorized for detection and/or diagnosis of SARS-CoV-2 by  FDA under an Emergency Use Authorization (EUA). This EUA will remain  in effect (meaning this test can be used) for the duration of the  Covid-19 declaration under Section 564(b)(1) of the Act, 21  U.S.C. section 360bbb-3(b)(1), unless the authorization is  terminated or revoked. Performed at [facility], [address] [place], [place] [address]      Radiological Exams on Admission: CT ABDOMEN PELVIS W CONTRAST  Result Date: 04/01/2020 CLINICAL DATA:  Acute abdominal pain. EXAM: CT ABDOMEN AND PELVIS WITH CONTRAST TECHNIQUE:  Multidetector CT imaging of the abdomen and pelvis was performed using the standard protocol following bolus administration of intravenous contrast. CONTRAST:  38m OMNIPAQUE IOHEXOL 300 MG/ML  SOLN COMPARISON:  February 11, 2017. FINDINGS: Lower chest: The lung bases are clear. The heart size is normal. Hepatobiliary: The liver is normal. The gallbladder is significantly distended. There are pericholecystic inflammatory changes.There is significant intrahepatic and extrahepatic biliary ductal dilatation. The common bile duct measures up to approximately 1.4 cm in diameter. Pancreas: The pancreas is atrophic. There are mild inflammatory changes near the head of the pancreas. Spleen: Unremarkable. Adrenals/Urinary Tract: --Adrenal glands: Unremarkable. --Right kidney/ureter: No hydronephrosis or radiopaque kidney stones. --Left kidney/ureter: No hydronephrosis or radiopaque kidney stones. --Urinary bladder: The urinary bladder is significantly distended. Stomach/Bowel: --Stomach/Duodenum: No hiatal hernia or other gastric abnormality. Normal duodenal course and caliber. --Small bowel: There are inflammatory changes surrounding the third and fourth portions of the duodenum. Duodenal diverticula are noted. --Colon: Rectosigmoid diverticulosis without acute inflammation. --Appendix: Normal. Vascular/Lymphatic: Atherosclerotic calcification is present within the non-aneurysmal abdominal aorta, without hemodynamically significant stenosis. Heavy  calcifications are noted in the mid to distal aorta likely resulting in some mild to moderate stenosis. --No retroperitoneal lymphadenopathy. --No mesenteric lymphadenopathy. --No pelvic or inguinal lymphadenopathy. Reproductive: There prominent pelvic veins bilaterally. Other: No ascites or free air. The abdominal wall is normal. Musculoskeletal. There is an age-indeterminate compression fracture of the L4 vertebral body, new since 2018. Multilevel degenerative changes are noted  throughout the lumbar spine. There is an old fracture of the inferior pubic ramus on the right with evidence for nonunion. There are old fractures involving the right superior pubic ramus and anterior column of the right acetabulum. Bilateral sacral fractures are noted status post bilateral sacral fixation. The patient is status post prior intramedullary nail placement through the left femur. There are end-stage degenerative changes of the left hip. There are moderate degenerative changes of the right hip. IMPRESSION: 1. Significantly distended gallbladder with pericholecystic inflammatory changes and significant intrahepatic and extrahepatic biliary ductal dilatation. Findings are concerning for acute cholecystitis or choledocholithiasis in the appropriate clinical setting. Consider further evaluation with ultrasound and MRCP as clinically indicated. 2. Inflammatory changes surrounding the head of the pancreas and duodenum. Findings may be secondary to acute interstitial pancreatitis or duodenitis. Correlation with laboratory studies is recommended. 3. Rectosigmoid diverticulosis without acute inflammation. 4. Age-indeterminate compression fracture of the L4 vertebral body, new since 2018. 5. Old pelvic fractures as above. Aortic Atherosclerosis (ICD10-I70.0). Electronically Signed   By: Constance Holster M.D.   On: 04/01/2020 22:50   DG Chest Port 1 View  Result Date: 04/01/2020 CLINICAL DATA:  Chest discomfort since lunch, previous tobacco abuse EXAM: PORTABLE CHEST 1 VIEW COMPARISON:  08/19/2018 FINDINGS: Single frontal view of the chest demonstrates an unremarkable cardiac silhouette. Background interstitial prominence consistent with history of tobacco abuse. No airspace disease, effusion, or pneumothorax. No acute bony abnormalities. IMPRESSION: 1. No acute intrathoracic process. Electronically Signed   By: Randa Ngo M.D.   On: 04/01/2020 20:04   US Abdomen Limited RUQ  Result Date:  04/01/2020 CLINICAL DATA:  Postprandial abdominal pain. EXAM: ULTRASOUND ABDOMEN LIMITED RIGHT UPPER QUADRANT COMPARISON:  None. FINDINGS: Gallbladder: No gallstones or wall thickening visualized (2.2 mm). No sonographic Murphy sign noted by sonographer. Common bile duct: Diameter: 6.5 mm Liver: No focal lesion identified. Within normal limits in parenchymal echogenicity. Portal vein is patent on color Doppler imaging with normal direction of blood flow towards the liver. Other: It should be noted that the study is limited secondary to overlying bowel gas. IMPRESSION: 1. Limited study secondary to overlying bowel gas. 2. Otherwise unremarkable right upper quadrant ultrasound. Electronically Signed   By: Virgina Norfolk M.D.   On: 04/01/2020 21:01    EKG: Independently reviewed. Sinus tachycardia (rate 107), 1st degree AV block.   Assessment/Plan   1. Acute cholecystitis; biliary pancreatitis  - Presents with acute-onset of upper abdominal pain and nausea after a meal and is found to have leukocytosis, alk phos 188, AST 1242, ALT 596, lipase 1575, and normal bilirubin  - CBD is 6.5 mm on [place] and CT notable for gallbladder distension, pericholecystic inflammation, extra- and intrahepatic biliary ductal dilation, and inflammation about the head of pancreas  - She was given IVF, analgesics, and started on antibiotcs in ED  - She has intermediate probability of CBD stone by ASGE guidelines and will get MRCP   - Continue antibiotics with Zosyn, continue bowel-rest, continue IVF and pain-control, check MRCP    2. Hyponatremia  - Serum sodium is 123 on admission, slightly lower  than recent priors   - She appears euvolemic on admission  - Continue maintenance IVF while NPO and monitor    3. Hypertensive urgency  - SBP was 200 in ED, now 150s after pain-control - Continue pain-control, use labetalol IVPs as needed    4. Hypothyroidism  - Follow-up pharmacy medication-reconciliation     DVT  prophylaxis: SCDs Code Status: DNR, confirmed with patient on admission  Family Communication: Discussed with patient  Disposition Plan:  Patient is from: Home  Anticipated d/c is to: Home  Anticipated d/c date is: 04/05/20 Patient currently: Pending MRCP and then likely GI and/or surgery consultations  Consults called: None  Admission status: Inpatient     Vianne Bulls, MD [facility] Pager: See [email or web address]  If 7AM-7PM, please contact the daytime attending [email or web address]  04/02/2020, 2:42 AM

## 2020-04-02 NOTE — Consult Note (Signed)
Referring Provider: Dr. Antonieta Pert Primary Care Physician:  [facility] Primary Gastroenterologist:  Althia Forts   Reason for Consultation:  Elevated LFTs   HPI: Meghan Kim is a 86 y.o. female with a past medical history of arthritis, hypertension, neuropathy and hypothyroidism.  She presented to [facility] ED on 04/01/2020 with complaints of nausea, lower chest pain and epigastric pain.  She developed indigestion for which she took Pepto-Bismol without relief.  In the ED, she was tachycardic and hypertensive with a blood pressure 200/90.  A twelve-lead EKG showed a sinus tachycardia rate 107 with first-degree AV block.  A chest x-ray was negative.  Troponin x 2 was normal.  WBC 18.8.  Sodium 123.  AST 1342.  ALT 596.  Lipase 1575.  Influenza A & B and SARS coronavirus 2 were negative.  Right upper quadrant ultrasound was somewhat limited due to overlying bowel gas, no gallstones or gallbladder wall thickening was noted.  The common bile duct diameter. measured at 6.5 mm.  Abdominal/pelvic CT identified a distended gallbladder with pericholecystic inflammatory changes and significant intra and extrahepatic biliary ductal dilatation concerning for acute cholecystitis cystitis and choledocholithiasis with inflammatory changes around the head of the pancreas.  An abdominal MRI/MRCP was done early this morning, the results are pending.  She received 1 l of LR, fentanyl and Zofran.  She was started on Rocephin and Flagyl.  GI consult was requested for evaluation for ERCP.  Upon entering the patient's exam room, her nurse was present at the bedside.  The patient  developed new onset expressive aphasia at this time. The rapid response team was called.  Hospitalist Dr. Lupita Leash is at the bedside at this time as well.  She is extremely hard of hearing and became anxious earlier this morning.  However, she has demonstrated expressive aphasia which is a new development.  When asked what she  wanted to watch on TV she responded president.  When asked how old she was she responded I been doing fine up until now.  She eventually stated she was 86 years old.  She is moving all extremities equally.  She is hemodynamically stable.  I am unable to obtain an accurate history and symptomology at this time.   ED course: Sodium 123.  Potassium 4.3.  Chloride 92.  Glucose 141.  BUN 12.  Creatinine 0.39.  Calcium 9.0.  Alk phos 188.  Albumin 3.8.  Lipase 1575.  AST 1342.  ALT 596.  Total bili 1.1.  WBC 11.8.  Hemoglobin 11.6.  Hematocrit 36.4.  MCV 68.0.  Platelet 242.  Troponin 7.  Laboratory studies 04/02/2020: Sodium 129.  Potassium 4.2.  BUN 7.  Creatinine 0.42.  Alk phos 160.  Albumin 3.2.  Lipase 896.  AST 736.  ALT 565.  Total bili 1.1.  WBC 10.5.  Hemoglobin 11.1.  Hematocrit 34.5.  MCV 68.3.  Platelet 225.  Abdominal/pelvic CT with contrast: 1. Significantly distended gallbladder with pericholecystic inflammatory changes and significant intrahepatic and extrahepatic biliary ductal dilatation. Findings are concerning for acute cholecystitis or choledocholithiasis in the appropriate clinical setting. Consider further evaluation with ultrasound and MRCP as clinically indicated. 2. Inflammatory changes surrounding the head of the pancreas and duodenum. Findings may be secondary to acute interstitial pancreatitis or duodenitis. Correlation with laboratory studies is recommended. 3. Rectosigmoid diverticulosis without acute inflammation. 4. Age-indeterminate compression fracture of the L4 vertebral body, new since 2018. 5. Old pelvic fractures as above. Aortic Atherosclerosis   RUQ sono 04/01/2020: 1.  Limited study secondary to overlying bowel gas. 2. Otherwise unremarkable right upper quadrant ultrasound  Abdominal MRI/MRCP 04/02/2020: pending   Past Medical History:  Diagnosis Date  . Arthritis   . Hypertension   . Neuropathy   . Thyroid disease     Past Surgical History:    Procedure Laterality Date  . arm surgery    . HIP SURGERY    . SHOULDER SURGERY    . WRIST SURGERY      Prior to Admission medications   Medication Sig Start Date End Date Taking? Authorizing Provider  alendronate (FOSAMAX) 70 MG tablet Take by mouth. 01/12/20  Yes [provider]  dupilumab (DUPIXENT) 300 MG/2ML prefilled syringe Inject into the skin. 02/05/20  Yes [provider]  fluticasone (FLONASE) 50 MCG/ACT nasal spray USE 2 SPRAYS IN EACH NOSTRIL TWICE DAILY 12/15/19  Yes [provider]  furosemide (LASIX) 20 MG tablet Take by mouth. 11/11/18  Yes [provider]  gabapentin (NEURONTIN) 300 MG capsule Take by mouth. 02/08/20 04/08/20 Yes [provider]  levothyroxine (SYNTHROID) 50 MCG tablet TAKE 1 TABLET BY MOUTH EVERY DAY AT 6:00AM 07/08/17  Yes [provider]  mirabegron ER (MYRBETRIQ) 25 MG TB24 tablet Take by mouth. 03/20/20  Yes [provider]  rOPINIRole (REQUIP) 0.25 MG tablet Take by mouth. 02/15/20  Yes [provider]  temazepam (RESTORIL) 15 MG capsule Take by mouth. 11/11/19  Yes [provider]  valsartan (DIOVAN) 80 MG tablet Take by mouth. 03/20/20  Yes [provider]  fexofenadine (ALLEGRA) 180 MG tablet Take by mouth.    [provider]  lisinopril (ZESTRIL) 10 MG tablet Take 10 mg by mouth daily. 11/02/19   [provider]  omeprazole (PRILOSEC) 20 MG capsule Take by mouth.    [provider]  STOOL SOFTENER 100 MG capsule Take 100 mg by mouth daily. 03/09/20   [provider]    Current Facility-Administered Medications  Medication Dose Route Frequency Provider Last Rate Last Admin  . 0.9 %  sodium chloride infusion   Intravenous Continuous Opyd, Ilene Qua, MD 70 mL/hr at 04/02/20 0258 New Bag at 04/02/20 0258  . acetaminophen (TYLENOL) tablet 650 mg  650 mg Oral Q6H PRN Opyd, Ilene Qua, MD   650 mg at 04/02/20 0316   Or  . acetaminophen  (TYLENOL) suppository 650 mg  650 mg Rectal Q6H PRN Opyd, Ilene Qua, MD      . fentaNYL (SUBLIMAZE) injection 12.5-25 mcg  12.5-25 mcg Intravenous Q2H PRN Opyd, Ilene Qua, MD      . labetalol (NORMODYNE) injection 10 mg  10 mg Intravenous Q2H PRN Opyd, Ilene Qua, MD      . ondansetron (ZOFRAN) injection 4 mg  4 mg Intravenous Q6H PRN Opyd, Ilene Qua, MD      . piperacillin-tazobactam (ZOSYN) IVPB 3.375 g  3.375 g Intravenous Q8H Opyd, Ilene Qua, MD 12.5 mL/hr at 04/02/20 0447 3.375 g at 04/02/20 0447  . sodium chloride flush (NS) 0.9 % injection 3 mL  3 mL Intravenous Q12H Opyd, Ilene Qua, MD   3 mL at 04/02/20 0259    Allergies as of 04/01/2020  . (No Known Allergies)    History reviewed. No pertinent family history.  Social History   Socioeconomic History  . Marital status: Unknown    Spouse name: Not on file  . Number of children: Not on file  . Years of education: Not on file  . Highest education level: Not  on file  Occupational History  . Not on file  Tobacco Use  . Smoking status: Former Research scientist (life sciences)  . Smokeless tobacco: Never Used  Substance and Sexual Activity  . Alcohol use: Yes    Comment: occ  . Drug use: Never  . Sexual activity: Not on file  Other Topics Concern  . Not on file  Social History Narrative  . Not on file   Social Determinants of Health   Financial Resource Strain:   . Difficulty of Paying Living Expenses:   Food Insecurity:   . Worried About Charity fundraiser in the Last Year:   . Arboriculturist in the Last Year:   Transportation Needs:   . Film/video editor (Medical):   Marland Kitchen Lack of Transportation (Non-Medical):   Physical Activity:   . Days of Exercise per Week:   . Minutes of Exercise per Session:   Stress:   . Feeling of Stress :   Social Connections:   . Frequency of Communication with Friends and Family:   . Frequency of Social Gatherings with Friends and Family:   . Attends Religious Services:   . Active Member of Clubs or  Organizations:   . Attends Archivist Meetings:   Marland Kitchen Marital Status:   Intimate Partner Violence:   . Fear of Current or Ex-Partner:   . Emotionally Abused:   Marland Kitchen Physically Abused:   . Sexually Abused:     Review of Systems: I am unable to obtain review of systems due to change in her neuro status demonstrated by expressive aphasia at this time  Physical Exam: Vital signs in last 24 hours: Temp:  [98.3 F (36.8 C)-99.3 F (37.4 C)] 98.3 F (36.8 C) (05/09 0454) Pulse Rate:  [77-106] 77 (05/09 0454) Resp:  [16-24] 20 (05/09 0454) BP: (133-201)/(69-91) 148/76 (05/09 0454) SpO2:  [93 %-100 %] 98 % (05/09 0454) Weight:  [54.4 kg-59.4 kg] 59.4 kg (05/09 0227) Last BM Date: 04/01/20   General:  Alert 86 year old female in no acute distress. Head:  Normocephalic and atraumatic. Eyes:  No scleral icterus. Conjunctiva pink. Ears:  Normal auditory acuity. Nose:  No deformity, discharge or lesions. Mouth:  Dentition intact. No ulcers or lesions.  Neck:  Supple. No lymphadenopathy or thyromegaly.  Lungs: Breath sounds clear throughout. Heart: Regular rate and rhythm, no murmurs. Abdomen: Moderate tenderness throughout the abdomen except epigastric area nontender.  No rebound or guarding.  Positive bowel sounds to all 4 quadrants.  No mass.  No HSM. Rectal: Deferred. Musculoskeletal:  Symmetrical without gross deformities.  Pulses:  Normal pulses noted. Extremities:  Without clubbing or edema. Neurologic: Pupils equal and reactive.  Moves all extremities equally.  Expressive aphasia with rapid response team at the bedside to further evaluate. Skin:  Intact without significant lesions or rashes. Psych:  Alert and cooperative. Normal mood and affect.  Intake/Output from previous day: 05/08 0701 - 05/09 0700 In: 1203 [I.V.:3; IV Piggyback:1200] Out: 60 [Urine:60] Intake/Output this shift: No intake/output data recorded.  Lab Results: Recent Labs    04/01/20 1923  04/02/20 0319  WBC 11.8* 10.5  HGB 11.6* 11.1*  HCT 36.4 34.5*  PLT 242 225   BMET Recent Labs    04/01/20 1923 04/02/20 0319  NA 123* 129*  K 4.3 4.2  CL 92* 96*  CO2 21* 23  GLUCOSE 141* 110*  BUN 12 7*  CREATININE 0.39* 0.42*  CALCIUM 9.0 8.8*   LFT Recent Labs    04/02/20  0319  PROT 6.0*  ALBUMIN 3.2*  AST 736*  ALT 565*  ALKPHOS 160*  BILITOT 1.1   PT/INR No results for input(s): LABPROT, INR in the last 72 hours. Hepatitis Panel No results for input(s): HEPBSAG, HCVAB, HEPAIGM, HEPBIGM in the last 72 hours.    Studies/Results: CT ABDOMEN PELVIS W CONTRAST  Result Date: 04/01/2020 CLINICAL DATA:  Acute abdominal pain. EXAM: CT ABDOMEN AND PELVIS WITH CONTRAST TECHNIQUE: Multidetector CT imaging of the abdomen and pelvis was performed using the standard protocol following bolus administration of intravenous contrast. CONTRAST:  42m OMNIPAQUE IOHEXOL 300 MG/ML  SOLN COMPARISON:  February 11, 2017. FINDINGS: Lower chest: The lung bases are clear. The heart size is normal. Hepatobiliary: The liver is normal. The gallbladder is significantly distended. There are pericholecystic inflammatory changes.There is significant intrahepatic and extrahepatic biliary ductal dilatation. The common bile duct measures up to approximately 1.4 cm in diameter. Pancreas: The pancreas is atrophic. There are mild inflammatory changes near the head of the pancreas. Spleen: Unremarkable. Adrenals/Urinary Tract: --Adrenal glands: Unremarkable. --Right kidney/ureter: No hydronephrosis or radiopaque kidney stones. --Left kidney/ureter: No hydronephrosis or radiopaque kidney stones. --Urinary bladder: The urinary bladder is significantly distended. Stomach/Bowel: --Stomach/Duodenum: No hiatal hernia or other gastric abnormality. Normal duodenal course and caliber. --Small bowel: There are inflammatory changes surrounding the third and fourth portions of the duodenum. Duodenal diverticula are noted.  --Colon: Rectosigmoid diverticulosis without acute inflammation. --Appendix: Normal. Vascular/Lymphatic: Atherosclerotic calcification is present within the non-aneurysmal abdominal aorta, without hemodynamically significant stenosis. Heavy calcifications are noted in the mid to distal aorta likely resulting in some mild to moderate stenosis. --No retroperitoneal lymphadenopathy. --No mesenteric lymphadenopathy. --No pelvic or inguinal lymphadenopathy. Reproductive: There prominent pelvic veins bilaterally. Other: No ascites or free air. The abdominal wall is normal. Musculoskeletal. There is an age-indeterminate compression fracture of the L4 vertebral body, new since 2018. Multilevel degenerative changes are noted throughout the lumbar spine. There is an old fracture of the inferior pubic ramus on the right with evidence for nonunion. There are old fractures involving the right superior pubic ramus and anterior column of the right acetabulum. Bilateral sacral fractures are noted status post bilateral sacral fixation. The patient is status post prior intramedullary nail placement through the left femur. There are end-stage degenerative changes of the left hip. There are moderate degenerative changes of the right hip. IMPRESSION: 1. Significantly distended gallbladder with pericholecystic inflammatory changes and significant intrahepatic and extrahepatic biliary ductal dilatation. Findings are concerning for acute cholecystitis or choledocholithiasis in the appropriate clinical setting. Consider further evaluation with ultrasound and MRCP as clinically indicated. 2. Inflammatory changes surrounding the head of the pancreas and duodenum. Findings may be secondary to acute interstitial pancreatitis or duodenitis. Correlation with laboratory studies is recommended. 3. Rectosigmoid diverticulosis without acute inflammation. 4. Age-indeterminate compression fracture of the L4 vertebral body, new since 2018. 5. Old pelvic  fractures as above. Aortic Atherosclerosis (ICD10-I70.0). Electronically Signed   By: CConstance HolsterM.D.   On: 04/01/2020 22:50   DG Chest Port 1 View  Result Date: 04/01/2020 CLINICAL DATA:  Chest discomfort since lunch, previous tobacco abuse EXAM: PORTABLE CHEST 1 VIEW COMPARISON:  08/19/2018 FINDINGS: Single frontal view of the chest demonstrates an unremarkable cardiac silhouette. Background interstitial prominence consistent with history of tobacco abuse. No airspace disease, effusion, or pneumothorax. No acute bony abnormalities. IMPRESSION: 1. No acute intrathoracic process. Electronically Signed   By: MRanda NgoM.D.   On: 04/01/2020 20:04   UKoreaAbdomen Limited RUQ  Result Date: 04/01/2020 CLINICAL DATA:  Postprandial abdominal pain. EXAM: ULTRASOUND ABDOMEN LIMITED RIGHT UPPER QUADRANT COMPARISON:  None. FINDINGS: Gallbladder: No gallstones or wall thickening visualized (2.2 mm). No sonographic Murphy sign noted by sonographer. Common bile duct: Diameter: 6.5 mm Liver: No focal lesion identified. Within normal limits in parenchymal echogenicity. Portal vein is patent on color Doppler imaging with normal direction of blood flow towards the liver. Other: It should be noted that the study is limited secondary to overlying bowel gas. IMPRESSION: 1. Limited study secondary to overlying bowel gas. 2. Otherwise unremarkable right upper quadrant ultrasound. Electronically Signed   By: Virgina Norfolk M.D.   On: 04/01/2020 21:01    IMPRESSION/PLAN:  2.  86 year old female presents with nausea/vomiting lower chest and epigastric pain.  Elevated alk phos, LFTs and lipase levels. CTAP identified a  significantly distended gallbladder with pericholecystic inflammatory changes, significant intrahepatic and extrahepatic biliary ductal dilatation, findings concerning for acute cholecystitis and choledocholithiasis and inflammatory changes surrounding the head of the pancreas and duodenum consistent  with acute pancreatitis/duodenitis. Alk phos 188- >160. AST 1342 -> 736. ALT 596 -> 565. T. Bili 1.1 -> 1.1. Lipase 1575 -> 736.  WBC 11.1 -> She is afebrile.  Hemodynamically stable. -NPO -IVFs per the hospitalist -Await abd MRI/MRCP results, she will most likely require ERCP -Repeat CBC with differential, BMP and hepatic panel in a.m.  2.  New onset expressive aphasia.  Rapid response team at the bedside.   -Recommend neuro consult -Check urine culture if not already done  3. Normocytic anemia. Hg 11.1. MCV 68.3.  -Check iron panel -Repeat CBC in am -Re-attempt obtaining accurate GI/past medical history and ROS when neuro status stabilized and when patient has her hearing aids in place.    Further recommendations per Dr. Adline Peals Dorathy Daft  04/02/2020, 0:9:30AM

## 2020-04-02 NOTE — Progress Notes (Signed)
PHARMACIST CODE STROKE RESPONSE  Notified to mix tPA at 1040 by Dr. Otelia Limes Delivered tPA to RN at 1043  tPA dose = 5.4mg  bolus over 1 minute followed by 48.1mg  for a total dose of 53.5mg  over 1 hour  Issues/delays encountered (if applicable):   Joaquim Lai PharmD. BCPS  04/02/20 10:45 AM

## 2020-04-02 NOTE — Progress Notes (Signed)
Pt noted with expressive aphasia and difficulty with word finding. Spoke with pt's son, Meghan Kim, via telephone. He states that this is new for her. States that her speech has been clear and articulate, and was normal was he visited her last night. Pt's speech was normal at bedside report at 0730 when I rounded with off going nurse. Expressive aphasia and slight Rt sided facial droop was first noted at 0915 when I checked on her after her bed alarm went off as she was moving around in the bed. No other neuro deficits were noted at that time.

## 2020-04-02 NOTE — Consult Note (Signed)
Referring Physician: Dr. Jonathon Bellows    Chief Complaint: Acute onset of aphasia  HPI: Meghan Kim is an 86 y.o. female with HTN, neuropathy and thyroid disease who initially presented yesterday evening with acute onset of pain in her lower chest and upper abdomen along with nausea. She was hypertensive and tachycardic in the ED with first degree AV nodal block. She was hyponatremic with Na of 123. AST, ALT and lipase were elevated and she had a leukocytosis of 18,800. CT abdomen revealed a distended gallbladder with pericholecystic inflammatory changes and significant intra and extrehepatic biliary ductal dilatation concerning for acute cholecystitis or choledocholithiasis. Inflammatory changes of the pancreatic head were also noted. GI was consulted for further evaluation of suspected acute cholecystitis/biliary pancreatitis/rule out choledocholithiasis.   This AM at 0730 she was noted to be normal. At 9:15 AM she was noted to have trouble getting her words out with word finding difficulty. She was clearly aware of the deficit and frustrated with it. She had no significant difficulty following commands. No focal motor or sensory deficit was noted by RN, except for possible mild right facial droop.   LSN: [id number] tPA Given: Yes NIHSS: 3  Past Medical History:  Diagnosis Date  . Arthritis   . Hypertension   . Neuropathy   . Thyroid disease     Past Surgical History:  Procedure Laterality Date  . arm surgery    . HIP SURGERY    . SHOULDER SURGERY    . WRIST SURGERY      History reviewed. No pertinent family history. Social History:  reports that she has quit smoking. She has never used smokeless tobacco. She reports current alcohol use. She reports that she does not use drugs.  Allergies: No Known Allergies  Medications:  Scheduled: . sodium chloride flush  3 mL Intravenous Q12H  Piperacillin-tazobactam   ROS: As per HPI. Does not endorse any additional symptoms. Comprehensive ROS  deferred in the context of acuity of presentation.   Physical Examination: Blood pressure (!) 154/59, pulse 77, temperature 97.9 F (36.6 C), temperature source Oral, resp. rate (!) 21, height  (1.575 m), weight 59.4 kg, SpO2 97 %.  HEENT: [place]/AT Lungs: Respirations unlabored Ext: No edema  Neurologic Examination: Mental Status: Alert, fully oriented to place and time. Thought content appropriate. Speech is dysfluent with frequent word-finding pauses and occasional phonemic and semantic paraphasias. Can name a thumb, pinky and forefinger with some difficulty. She cannot name a nametag, perseverating with "cellphone" as the answer. Attention is normal. Able to follow all basic motor commands. Repetition impaired. Mild dysarthria also noted.  Cranial Nerves: II:  Visual fields intact in all 4 quadrants OU when tested individually, but there is consistent right sided extinction with DSS on multiple trials. PERRL.   III,IV, VI: Ptosis not present. EOMI.  V,VII: Smile symmetric, facial temp sensation normal bilaterally VIII: HOH IX,X: Palate rises symmetrically  XI: Symmetric shoulder shrug XII: midline tongue extension  Motor: Right : Upper extremity   5/5    Left:     Upper extremity   5/5  Lower extremity   5/5     Lower extremity   5/5 No pronator drift Sensory: Temp and light touch intact throughout, bilaterally Deep Tendon Reflexes:  2+ bilateral brachioradialis and biceps. 2+ right patellar, 0 left patellar Plantars: Right: downgoing   Left: downgoing Cerebellar: No ataxia with FNF and H-S bilaterally.  Gait: Deferred  Results for orders placed or performed during the hospital encounter of  04/01/20 (from the past 48 hour(s))  CBC with Differential/Platelet     Status: Abnormal   Collection Time: 04/01/20  7:23 PM  Result Value Ref Range   WBC 11.8 (H) 4.0 - 10.5 K/uL   RBC 5.35 (H) 3.87 - 5.11 MIL/uL   Hemoglobin 11.6 (L) 12.0 - 15.0 g/dL   HCT 16.136.4 09.636.0 - 04.546.0 %   MCV  68.0 (L) 80.0 - 100.0 fL   MCH 21.7 (L) 26.0 - 34.0 pg   MCHC 31.9 30.0 - 36.0 g/dL   RDW 40.914.6 81.111.5 - 91.415.5 %   Platelets 242 150 - 400 K/uL   nRBC 0.0 0.0 - 0.2 %   Neutrophils Relative % 91 %   Neutro Abs 10.6 (H) 1.7 - 7.7 K/uL   Lymphocytes Relative 4 %   Lymphs Abs 0.5 (L) 0.7 - 4.0 K/uL   Monocytes Relative 5 %   Monocytes Absolute 0.6 0.1 - 1.0 K/uL   Eosinophils Relative 0 %   Eosinophils Absolute 0.0 0.0 - 0.5 K/uL   Basophils Relative 0 %   Basophils Absolute 0.0 0.0 - 0.1 K/uL   WBC Morphology TOXIC GRANULATION    Smear Review Normal platelet morphology    Immature Granulocytes 0 %   Abs Immature Granulocytes 0.05 0.00 - 0.07 K/uL   Schistocytes PRESENT    Ovalocytes PRESENT     Comment: Performed at [facility] [place], [address] [place], [place] [address]  Comprehensive metabolic panel     Status: Abnormal   Collection Time: 04/01/20  7:23 PM  Result Value Ref Range   Sodium 123 (L) 135 - 145 mmol/L   Potassium 4.3 3.5 - 5.1 mmol/L   Chloride 92 (L) 98 - 111 mmol/L   CO2 21 (L) 22 - 32 mmol/L   Glucose, Bld 141 (H) 70 - 99 mg/dL    Comment: Glucose reference range applies only to samples taken after fasting for at least 8 hours.   BUN 12 8 - 23 mg/dL   Creatinine, Ser 6.210.39 (L) 0.44 - 1.00 mg/dL   Calcium 9.0 8.9 - 30.810.3 mg/dL   Total Protein 6.8 6.5 - 8.1 g/dL   Albumin 3.8 3.5 - 5.0 g/dL   AST 6,5781,342 (H) 15 - 41 U/L   ALT 596 (H) 0 - 44 U/L   Alkaline Phosphatase 188 (H) 38 - 126 U/L   Total Bilirubin 1.1 0.3 - 1.2 mg/dL   GFR calc non Af Amer >60 >60 mL/min   GFR calc Af Amer >60 >60 mL/min   Anion gap 10 5 - 15    Comment: Performed at [facility] [place], [address] [place], [place] [address]  Lipase, blood     Status: Abnormal   Collection Time: 04/01/20  7:23 PM  Result Value Ref Range   Lipase 1,575 (H) 11 - 51 U/L    Comment: RESULTS CONFIRMED BY MANUAL DILUTION Performed at [facility] [place], [address] [place], [place] [address]   Troponin I (High Sensitivity)     Status: None   Collection Time: 04/01/20  7:23 PM  Result Value Ref Range   Troponin I (High Sensitivity) 6 <18 ng/L    Comment: (NOTE) Elevated high sensitivity troponin I (hsTnI) values and significant  changes across serial measurements may suggest ACS but many other  chronic and acute conditions are known to elevate hsTnI results.  Refer to the "Links" section for chest pain algorithms and  additional  guidance. Performed at [facility], [address] [place], [place] [address]   Respiratory Panel by RT PCR (Flu A&B, Covid) - Nasopharyngeal Swab     Status: None   Collection Time: 04/01/20 10:08 PM   Specimen: Nasopharyngeal Swab  Result Value Ref Range   SARS Coronavirus 2 by RT PCR NEGATIVE NEGATIVE    Comment: (NOTE) SARS-CoV-2 target nucleic acids are NOT DETECTED. The SARS-CoV-2 RNA is generally detectable in upper respiratoy specimens during the acute phase of infection. The lowest concentration of SARS-CoV-2 viral copies this assay can detect is 131 copies/mL. A negative result does not preclude SARS-Cov-2 infection and should not be used as the sole basis for treatment or other patient management decisions. A negative result may occur with  improper specimen collection/handling, submission of specimen other than nasopharyngeal swab, presence of viral mutation(s) within the areas targeted by this assay, and inadequate number of viral copies (<131 copies/mL). A negative result must be combined with clinical observations, patient history, and epidemiological information. The expected result is Negative. Fact Sheet for Patients:  https://www.moore.com/ Fact Sheet for Healthcare Providers:  https://www.young.biz/ This test is not yet ap proved or cleared by the [place] FDA and  has been authorized for detection and/or diagnosis of SARS-CoV-2 by FDA under an  Emergency Use Authorization (EUA). This EUA will remain  in effect (meaning this test can be used) for the duration of the COVID-19 declaration under Section 564(b)(1) of the Act, 21 U.S.C. section 360bbb-3(b)(1), unless the authorization is terminated or revoked sooner.    Influenza A by PCR NEGATIVE NEGATIVE   Influenza B by PCR NEGATIVE NEGATIVE    Comment: (NOTE) The Xpert Xpress SARS-CoV-2/FLU/RSV assay is intended as an aid in  the diagnosis of influenza from Nasopharyngeal swab specimens and  should not be used as a sole basis for treatment. Nasal washings and  aspirates are unacceptable for Xpert Xpress SARS-CoV-2/FLU/RSV  testing. Fact Sheet for Patients: https://www.moore.com/ Fact Sheet for Healthcare Providers: https://www.young.biz/ This test is not yet approved or cleared by the [place] FDA and  has been authorized for detection and/or diagnosis of SARS-CoV-2 by  FDA under an Emergency Use Authorization (EUA). This EUA will remain  in effect (meaning this test can be used) for the duration of the  Covid-19 declaration under Section 564(b)(1) of the Act, 21  U.S.C. section 360bbb-3(b)(1), unless the authorization is  terminated or revoked. Performed at [facility], [address] [place], [place] [address]   Troponin I (High Sensitivity)     Status: None   Collection Time: 04/01/20 10:41 PM  Result Value Ref Range   Troponin I (High Sensitivity) 7 <18 ng/L    Comment: (NOTE) Elevated high sensitivity troponin I (hsTnI) values and significant  changes across serial measurements may suggest ACS but many other  chronic and acute conditions are known to elevate hsTnI results.  Refer to the "Links" section for chest pain algorithms and additional  guidance. Performed at [facility], [address] [place], [place] [address]   Comprehensive metabolic panel     Status: Abnormal   Collection Time:  04/02/20  3:19 AM  Result Value Ref Range   Sodium 129 (L) 135 - 145 mmol/L   Potassium 4.2 3.5 - 5.1 mmol/L   Chloride 96 (L) 98 - 111 mmol/L   CO2 23 22 - 32 mmol/L   Glucose, Bld 110 (H) 70 - 99 mg/dL  Comment: Glucose reference range applies only to samples taken after fasting for at least 8 hours.   BUN 7 (L) 8 - 23 mg/dL   Creatinine, Ser 1.61 (L) 0.44 - 1.00 mg/dL   Calcium 8.8 (L) 8.9 - 10.3 mg/dL   Total Protein 6.0 (L) 6.5 - 8.1 g/dL   Albumin 3.2 (L) 3.5 - 5.0 g/dL   AST 096 (H) 15 - 41 U/L   ALT 565 (H) 0 - 44 U/L   Alkaline Phosphatase 160 (H) 38 - 126 U/L   Total Bilirubin 1.1 0.3 - 1.2 mg/dL   GFR calc non Af Amer >60 >60 mL/min   GFR calc Af Amer >60 >60 mL/min   Anion gap 10 5 - 15    Comment: Performed at [facility], [address], [place], [place] [address]  CBC WITH DIFFERENTIAL     Status: Abnormal   Collection Time: 04/02/20  3:19 AM  Result Value Ref Range   WBC 10.5 4.0 - 10.5 K/uL   RBC 5.05 3.87 - 5.11 MIL/uL   Hemoglobin 11.1 (L) 12.0 - 15.0 g/dL   HCT 98.1 (L) 19.1 - 47.8 %   MCV 68.3 (L) 80.0 - 100.0 fL   MCH 22.0 (L) 26.0 - 34.0 pg   MCHC 32.2 30.0 - 36.0 g/dL   RDW 29.5 62.1 - 30.8 %   Platelets 225 150 - 400 K/uL    Comment: REPEATED TO VERIFY   nRBC 0.0 0.0 - 0.2 %   Neutrophils Relative % 84 %   Neutro Abs 8.9 (H) 1.7 - 7.7 K/uL   Lymphocytes Relative 7 %   Lymphs Abs 0.7 0.7 - 4.0 K/uL   Monocytes Relative 8 %   Monocytes Absolute 0.8 0.1 - 1.0 K/uL   Eosinophils Relative 1 %   Eosinophils Absolute 0.1 0.0 - 0.5 K/uL   Basophils Relative 0 %   Basophils Absolute 0.0 0.0 - 0.1 K/uL   Immature Granulocytes 0 %   Abs Immature Granulocytes 0.04 0.00 - 0.07 K/uL   Basophilic Stippling PRESENT    Ovalocytes PRESENT     Comment: Performed at [facility], [address] [place], [place] [address]  Lipase, blood     Status: Abnormal   Collection Time: 04/02/20  3:19 AM  Result Value Ref Range   Lipase 896 (H) 11 - 51  U/L    Comment: RESULTS CONFIRMED BY MANUAL DILUTION Performed at [facility], [address] [place], [place] [address]   Glucose, capillary     Status: None   Collection Time: 04/02/20  9:59 AM  Result Value Ref Range   Glucose-Capillary 83 70 - 99 mg/dL    Comment: Glucose reference range applies only to samples taken after fasting for at least 8 hours.   CT ABDOMEN PELVIS W CONTRAST  Result Date: 04/01/2020 CLINICAL DATA:  Acute abdominal pain. EXAM: CT ABDOMEN AND PELVIS WITH CONTRAST TECHNIQUE: Multidetector CT imaging of the abdomen and pelvis was performed using the standard protocol following bolus administration of intravenous contrast. CONTRAST:  39mL OMNIPAQUE IOHEXOL 300 MG/ML  SOLN COMPARISON:  February 11, 2017. FINDINGS: Lower chest: The lung bases are clear. The heart size is normal. Hepatobiliary: The liver is normal. The gallbladder is significantly distended. There are pericholecystic inflammatory changes.There is significant intrahepatic and extrahepatic biliary ductal dilatation. The common bile duct measures up to approximately 1.4 cm in diameter. Pancreas: The pancreas is atrophic. There are mild inflammatory changes  near the head of the pancreas. Spleen: Unremarkable. Adrenals/Urinary Tract: --Adrenal glands: Unremarkable. --Right kidney/ureter: No hydronephrosis or radiopaque kidney stones. --Left kidney/ureter: No hydronephrosis or radiopaque kidney stones. --Urinary bladder: The urinary bladder is significantly distended. Stomach/Bowel: --Stomach/Duodenum: No hiatal hernia or other gastric abnormality. Normal duodenal course and caliber. --Small bowel: There are inflammatory changes surrounding the third and fourth portions of the duodenum. Duodenal diverticula are noted. --Colon: Rectosigmoid diverticulosis without acute inflammation. --Appendix: Normal. Vascular/Lymphatic: Atherosclerotic calcification is present within the non-aneurysmal abdominal aorta, without  hemodynamically significant stenosis. Heavy calcifications are noted in the mid to distal aorta likely resulting in some mild to moderate stenosis. --No retroperitoneal lymphadenopathy. --No mesenteric lymphadenopathy. --No pelvic or inguinal lymphadenopathy. Reproductive: There prominent pelvic veins bilaterally. Other: No ascites or free air. The abdominal wall is normal. Musculoskeletal. There is an age-indeterminate compression fracture of the L4 vertebral body, new since 2018. Multilevel degenerative changes are noted throughout the lumbar spine. There is an old fracture of the inferior pubic ramus on the right with evidence for nonunion. There are old fractures involving the right superior pubic ramus and anterior column of the right acetabulum. Bilateral sacral fractures are noted status post bilateral sacral fixation. The patient is status post prior intramedullary nail placement through the left femur. There are end-stage degenerative changes of the left hip. There are moderate degenerative changes of the right hip. IMPRESSION: 1. Significantly distended gallbladder with pericholecystic inflammatory changes and significant intrahepatic and extrahepatic biliary ductal dilatation. Findings are concerning for acute cholecystitis or choledocholithiasis in the appropriate clinical setting. Consider further evaluation with ultrasound and MRCP as clinically indicated. 2. Inflammatory changes surrounding the head of the pancreas and duodenum. Findings may be secondary to acute interstitial pancreatitis or duodenitis. Correlation with laboratory studies is recommended. 3. Rectosigmoid diverticulosis without acute inflammation. 4. Age-indeterminate compression fracture of the L4 vertebral body, new since 2018. 5. Old pelvic fractures as above. Aortic Atherosclerosis (ICD10-I70.0). Electronically Signed   By: Katherine Mantle M.D.   On: 04/01/2020 22:50   DG Chest Port 1 View  Result Date: 04/01/2020 CLINICAL  DATA:  Chest discomfort since lunch, previous tobacco abuse EXAM: PORTABLE CHEST 1 VIEW COMPARISON:  08/19/2018 FINDINGS: Single frontal view of the chest demonstrates an unremarkable cardiac silhouette. Background interstitial prominence consistent with history of tobacco abuse. No airspace disease, effusion, or pneumothorax. No acute bony abnormalities. IMPRESSION: 1. No acute intrathoracic process. Electronically Signed   By: Sharlet Salina M.D.   On: 04/01/2020 20:04   US Abdomen Limited RUQ  Result Date: 04/01/2020 CLINICAL DATA:  Postprandial abdominal pain. EXAM: ULTRASOUND ABDOMEN LIMITED RIGHT UPPER QUADRANT COMPARISON:  None. FINDINGS: Gallbladder: No gallstones or wall thickening visualized (2.2 mm). No sonographic Murphy sign noted by sonographer. Common bile duct: Diameter: 6.5 mm Liver: No focal lesion identified. Within normal limits in parenchymal echogenicity. Portal vein is patent on color Doppler imaging with normal direction of blood flow towards the liver. Other: It should be noted that the study is limited secondary to overlying bowel gas. IMPRESSION: 1. Limited study secondary to overlying bowel gas. 2. Otherwise unremarkable right upper quadrant ultrasound. Electronically Signed   By: Aram Candela M.D.   On: 04/01/2020 21:01    Assessment: 86 y.o. female with acute onset of expressive dysphasia and right visual field extinction with DSS.  1. CT head negative for acute abnormality.  2. Exam best localizes to the left temporooccipital region. Overall clinical presentation is most consistent with acute stroke.  3. Stroke Risk  Factors - HTN  Recommendations: 1 After comprehensive review of possible contraindications, the patient has no absolute contraindications to tPA administration. 2. The patient is a tPA candidate. Discussed extensively the risks/benefits of tPA treatment vs. no treatment with her son by telephone, including risks of hemorrhage and death with tPA  administration versus worse overall outcomes on average in patients within tPA time window who are not administered tPA. The patient's aphasia precludes meaningful medical decision making on her part at this time. Overall benefits of tPA regarding long-term prognosis are felt to outweigh risks. The patient's son expressed understanding and wish to proceed with tPA. Telephone consent was witnessed by Kennis Carina, RN. 3. Post-tPA order set to include frequent neuro checks and BP management. No antiplatelet medications or anticoagulants for at least 24 hours following tPA.  4. DVT prophylaxis with SCDs.  5. Will need to start antiplatelet therapy if follow up CT at 24 hours is negative for hemorrhagic conversion. 7. CTA of head and neck 8. MRI brain.  9. TTE.  10. PT/OT/Speech.  11. NPO until passes swallow evaluation.  12. Telemetry monitoring 13. Fasting lipid panel, HgbA1c  @Electronically  signed: Dr. Kerney Elbe  04/02/2020, 10:23 AM

## 2020-04-02 NOTE — Plan of Care (Signed)
SBP goal <180 is maintained without cleviprex gtt

## 2020-04-02 NOTE — Progress Notes (Signed)
Verbal MD order received, ok to obtain 2nd PIV. Pt currently on cleviprex gtt and has IV zosyn ordered

## 2020-04-02 NOTE — Progress Notes (Signed)
PROGRESS NOTE    Meghan Kim  VEL:[id number] DOB: 1926/05/04 DOA: 04/01/2020 PCP: [facility]   Brief Narrative:   86 y.o. female with medical history significant for hypertension, hypothyroidism, arthritis, and neuropathy [facility] on 5/8 with complaint of pain in the lower chest and upper abdomen after having her lunch.  She was seen in the ED was hypertensive 200/90, tachycardic, with first-degree AV nodal block, chest x-ray no acute finding, troponin negative x2 blood work showed hyponatremia sodium 123 abnormal LFTs AST 1342, ALT 596, and lipase 1575. leukocytosis to 18,800.  Covid and influenza PCR were negative. RUQ [place]- CBD diameter of 6.5 and otherwise unremarkable though limited by overlying bowel gas.  CT the abdomen pelvis reveals significantly distended gallbladder with pericholecystic inflammatory changes and significant intra and extrahepatic biliary ductal dilatation concerning for acute cholecystitis or choledocholithiasis.  Also noted on CT is inflammatory changes about the head of the pancreas.  Patient was given a liter of lactated Ringer's, Zofran, Rocephin, Flagyl, and multiple doses of IV fentanyl in the ED. GI was contacted and patient was admitted to [facility].  Subjective: Rapid response nurse evaluated the patient this morning due to concern for expressive aphasia patient able to follow commands but intermittently unable to name objects.Grossly nonfocal.  Assessment & Plan:  Abdominal discomfort with elevated LFTs/elevated lipase, suspected acute cholecystitis/biliary pancreatitis/rule out choledocholithiasis: Patient underwent right upper quadrant ultrasound and CT abdomen as above, GI was contacted.MRCP ordered to eval.I notified [facility] this morning. LFTs seems to be improving, leukocytosis resolved.  Continue empiric Zosyn n.p.o. IV fluids, nausea medication and pain management Recent Labs  Lab 04/01/20 1923  04/02/20 0319  AST 1,342* 736*  ALT 596* 565*  ALKPHOS 188* 160*  BILITOT 1.1 1.1  PROT 6.8 6.0*  ALBUMIN 3.8 3.2*   Expressive aphasia/word finding difficulty noted at 9:15 AM last known normal 7.30 am. Also she is hard of hearing-hearing aid does not have charge and son getting the charger.  Grossly nonfocal,activated code stroke  And neuro eval.  Hypothyroidism: Resume p.o. Synthroid once able to take orally.  Hypertension: Blood pressure is fairly controlled.  Holding home Lasix, valsartan and lisinopril.  Can use as needed IV medication if needed.  Chronic hyponatremia, 123 on admission slightly lower than recent prior labs, overall euvolemic, she is on IV fluids while n.p.o.  Monitor labs. Recent Labs  Lab 04/01/20 1923 04/02/20 0319  NA 123* 129*   DVT prophylaxis:SCD. Holding Heparin/lovenox incase he needs procedure. Code Status:DNR Family Communication: plan of care discussed with patient at bedside.  Status is: Inpatient Remains inpatient appropriate because:Ongoing diagnostic testing needed not appropriate for outpatient work up and IV treatments appropriate due to intensity of illness or inability to take PO  Dispo: The patient is from: Home              Anticipated d/c is to: Home              Anticipated d/c date is: > 3 days              Patient currently is not medically stable to d/c.  Nutrition: Diet Order            Diet NPO time specified Except for: Ice Chips  Diet effective now              Body mass index is 23.96 kg/m. Consultants:see note  Procedures:see note Microbiology:see note  Medications: Scheduled  Meds: . sodium chloride flush  3 mL Intravenous Q12H   Continuous Infusions: . sodium chloride 70 mL/hr at 04/02/20 0258  . piperacillin-tazobactam (ZOSYN)  IV 3.375 g (04/02/20 0447)    Antimicrobials: Anti-infectives (From admission, onward)   Start     Dose/Rate Route Frequency Ordered Stop   04/02/20 0400   piperacillin-tazobactam (ZOSYN) IVPB 3.375 g     3.375 g 12.5 mL/hr over 240 Minutes Intravenous Every 8 hours 04/02/20 0243     04/01/20 2345  metroNIDAZOLE (FLAGYL) IVPB 500 mg     500 mg 100 mL/hr over 60 Minutes Intravenous  Once 04/01/20 2340 04/02/20 0134   04/01/20 2300  cefTRIAXone (ROCEPHIN) 1 g in sodium chloride 0.9 % 100 mL IVPB     1 g 200 mL/hr over 30 Minutes Intravenous  Once 04/01/20 2257 04/01/20 2358       Objective: Vitals: Today's Vitals   04/02/20 0227 04/02/20 0344 04/02/20 0454 04/02/20 0819  BP: (!) 156/73  (!) 148/76 (!) 154/59  Pulse: 92  77 77  Resp: 20  20 (!) 21  Temp: 99.3 F (37.4 C)  98.3 F (36.8 C) 97.9 F (36.6 C)  TempSrc:   Oral Oral  SpO2: 100%  98% 97%  Weight: 59.4 kg     Height:      PainSc:  4       Intake/Output Summary (Last 24 hours) at 04/02/2020 1023 Last data filed at 04/02/2020 0259 Gross per 24 hour  Intake 1203 ml  Output 60 ml  Net 1143 ml   Filed Weights   04/01/20 1855 04/02/20 0227  Weight: 54.4 kg 59.4 kg   Weight change:    Intake/Output from previous day: 05/08 0701 - 05/09 0700 In: 1203 [I.V.:3; IV Piggyback:1200] Out: 60 [Urine:60] Intake/Output this shift: No intake/output data recorded.  Examination:  General exam: AA oriented at baseline, hard of hearing,weak appearing. HEENT:Oral mucosa moist, Ear/Nose WNL grossly,dentition normal. Respiratory system: bilaterally clear,no wheezing or crackles,no use of accessory muscle, non tender. Cardiovascular system: S1 & S2 +, regular, No JVD. Gastrointestinal system: Abdomen soft, generalized tenderness present, ND, BS+. Nervous System:Alert, awake, moving extremities and grossly nonfocal, ?  Expressive aphasia with intermittent word finding difficulties, able to follow commabds but is hard of hearing. Extremities: No edema, distal peripheral pulses palpable.  Skin: No rashes,no icterus. MSK: Normal muscle bulk,tone, power  Data Reviewed: I have  personally reviewed following labs and imaging studies CBC: Recent Labs  Lab 04/01/20 1923 04/02/20 0319  WBC 11.8* 10.5  NEUTROABS 10.6* 8.9*  HGB 11.6* 11.1*  HCT 36.4 34.5*  MCV 68.0* 68.3*  PLT 242 225   Basic Metabolic Panel: Recent Labs  Lab 04/01/20 1923 04/02/20 0319  NA 123* 129*  K 4.3 4.2  CL 92* 96*  CO2 21* 23  GLUCOSE 141* 110*  BUN 12 7*  CREATININE 0.39* 0.42*  CALCIUM 9.0 8.8*   GFR: Estimated Creatinine Clearance: 36.2 mL/min (A) (by C-G formula based on SCr of 0.42 mg/dL (L)). Liver Function Tests: Recent Labs  Lab 04/01/20 1923 04/02/20 0319  AST 1,342* 736*  ALT 596* 565*  ALKPHOS 188* 160*  BILITOT 1.1 1.1  PROT 6.8 6.0*  ALBUMIN 3.8 3.2*   Recent Labs  Lab 04/01/20 1923 04/02/20 0319  LIPASE 1,575* 896*   No results for input(s): AMMONIA in the last 168 hours. Coagulation Profile: No results for input(s): INR, PROTIME in the last 168 hours. Cardiac Enzymes: No results for input(s): CKTOTAL, CKMB,  CKMBINDEX, TROPONINI in the last 168 hours. BNP (last 3 results) No results for input(s): PROBNP in the last 8760 hours. HbA1C: No results for input(s): HGBA1C in the last 72 hours. CBG: Recent Labs  Lab 04/02/20 0959  GLUCAP 83   Lipid Profile: No results for input(s): CHOL, HDL, LDLCALC, TRIG, CHOLHDL, LDLDIRECT in the last 72 hours. Thyroid Function Tests: No results for input(s): TSH, T4TOTAL, FREET4, T3FREE, THYROIDAB in the last 72 hours. Anemia Panel: No results for input(s): VITAMINB12, FOLATE, FERRITIN, TIBC, IRON, RETICCTPCT in the last 72 hours. Sepsis Labs: No results for input(s): PROCALCITON, LATICACIDVEN in the last 168 hours.  Recent Results (from the past 240 hour(s))  Respiratory Panel by RT PCR (Flu A&B, Covid) - Nasopharyngeal Swab     Status: None   Collection Time: 04/01/20 10:08 PM   Specimen: Nasopharyngeal Swab  Result Value Ref Range Status   SARS Coronavirus 2 by RT PCR NEGATIVE NEGATIVE Final     Comment: (NOTE) SARS-CoV-2 target nucleic acids are NOT DETECTED. The SARS-CoV-2 RNA is generally detectable in upper respiratoy specimens during the acute phase of infection. The lowest concentration of SARS-CoV-2 viral copies this assay can detect is 131 copies/mL. A negative result does not preclude SARS-Cov-2 infection and should not be used as the sole basis for treatment or other patient management decisions. A negative result may occur with  improper specimen collection/handling, submission of specimen other than nasopharyngeal swab, presence of viral mutation(s) within the areas targeted by this assay, and inadequate number of viral copies (<131 copies/mL). A negative result must be combined with clinical observations, patient history, and epidemiological information. The expected result is Negative. Fact Sheet for Patients:  https://www.moore.com/ Fact Sheet for Healthcare Providers:  https://www.young.biz/ This test is not yet ap proved or cleared by the [place] FDA and  has been authorized for detection and/or diagnosis of SARS-CoV-2 by FDA under an Emergency Use Authorization (EUA). This EUA will remain  in effect (meaning this test can be used) for the duration of the COVID-19 declaration under Section 564(b)(1) of the Act, 21 U.S.C. section 360bbb-3(b)(1), unless the authorization is terminated or revoked sooner.    Influenza A by PCR NEGATIVE NEGATIVE Final   Influenza B by PCR NEGATIVE NEGATIVE Final    Comment: (NOTE) The Xpert Xpress SARS-CoV-2/FLU/RSV assay is intended as an aid in  the diagnosis of influenza from Nasopharyngeal swab specimens and  should not be used as a sole basis for treatment. Nasal washings and  aspirates are unacceptable for Xpert Xpress SARS-CoV-2/FLU/RSV  testing. Fact Sheet for Patients: https://www.moore.com/ Fact Sheet for Healthcare  Providers: https://www.young.biz/ This test is not yet approved or cleared by the [place] FDA and  has been authorized for detection and/or diagnosis of SARS-CoV-2 by  FDA under an Emergency Use Authorization (EUA). This EUA will remain  in effect (meaning this test can be used) for the duration of the  Covid-19 declaration under Section 564(b)(1) of the Act, 21  U.S.C. section 360bbb-3(b)(1), unless the authorization is  terminated or revoked. Performed at [facility], [address], [place], [place] [address]       Radiology Studies: CT ABDOMEN PELVIS W CONTRAST  Result Date: 04/01/2020 CLINICAL DATA:  Acute abdominal pain. EXAM: CT ABDOMEN AND PELVIS WITH CONTRAST TECHNIQUE: Multidetector CT imaging of the abdomen and pelvis was performed using the standard protocol following bolus administration of intravenous contrast. CONTRAST:  19mL OMNIPAQUE IOHEXOL 300 MG/ML  SOLN COMPARISON:  February 11, 2017. FINDINGS: Lower chest: The lung bases are clear. The heart size is normal. Hepatobiliary: The liver is normal. The gallbladder is significantly distended. There are pericholecystic inflammatory changes.There is significant intrahepatic and extrahepatic biliary ductal dilatation. The common bile duct measures up to approximately 1.4 cm in diameter. Pancreas: The pancreas is atrophic. There are mild inflammatory changes near the head of the pancreas. Spleen: Unremarkable. Adrenals/Urinary Tract: --Adrenal glands: Unremarkable. --Right kidney/ureter: No hydronephrosis or radiopaque kidney stones. --Left kidney/ureter: No hydronephrosis or radiopaque kidney stones. --Urinary bladder: The urinary bladder is significantly distended. Stomach/Bowel: --Stomach/Duodenum: No hiatal hernia or other gastric abnormality. Normal duodenal course and caliber. --Small bowel: There are inflammatory changes surrounding the third and fourth portions of the duodenum. Duodenal  diverticula are noted. --Colon: Rectosigmoid diverticulosis without acute inflammation. --Appendix: Normal. Vascular/Lymphatic: Atherosclerotic calcification is present within the non-aneurysmal abdominal aorta, without hemodynamically significant stenosis. Heavy calcifications are noted in the mid to distal aorta likely resulting in some mild to moderate stenosis. --No retroperitoneal lymphadenopathy. --No mesenteric lymphadenopathy. --No pelvic or inguinal lymphadenopathy. Reproductive: There prominent pelvic veins bilaterally. Other: No ascites or free air. The abdominal wall is normal. Musculoskeletal. There is an age-indeterminate compression fracture of the L4 vertebral body, new since 2018. Multilevel degenerative changes are noted throughout the lumbar spine. There is an old fracture of the inferior pubic ramus on the right with evidence for nonunion. There are old fractures involving the right superior pubic ramus and anterior column of the right acetabulum. Bilateral sacral fractures are noted status post bilateral sacral fixation. The patient is status post prior intramedullary nail placement through the left femur. There are end-stage degenerative changes of the left hip. There are moderate degenerative changes of the right hip. IMPRESSION: 1. Significantly distended gallbladder with pericholecystic inflammatory changes and significant intrahepatic and extrahepatic biliary ductal dilatation. Findings are concerning for acute cholecystitis or choledocholithiasis in the appropriate clinical setting. Consider further evaluation with ultrasound and MRCP as clinically indicated. 2. Inflammatory changes surrounding the head of the pancreas and duodenum. Findings may be secondary to acute interstitial pancreatitis or duodenitis. Correlation with laboratory studies is recommended. 3. Rectosigmoid diverticulosis without acute inflammation. 4. Age-indeterminate compression fracture of the L4 vertebral body, new  since 2018. 5. Old pelvic fractures as above. Aortic Atherosclerosis (ICD10-I70.0). Electronically Signed   By: Katherine Mantle M.D.   On: 04/01/2020 22:50   DG Chest Port 1 View  Result Date: 04/01/2020 CLINICAL DATA:  Chest discomfort since lunch, previous tobacco abuse EXAM: PORTABLE CHEST 1 VIEW COMPARISON:  08/19/2018 FINDINGS: Single frontal view of the chest demonstrates an unremarkable cardiac silhouette. Background interstitial prominence consistent with history of tobacco abuse. No airspace disease, effusion, or pneumothorax. No acute bony abnormalities. IMPRESSION: 1. No acute intrathoracic process. Electronically Signed   By: Sharlet Salina M.D.   On: 04/01/2020 20:04   US Abdomen Limited RUQ  Result Date: 04/01/2020 CLINICAL DATA:  Postprandial abdominal pain. EXAM: ULTRASOUND ABDOMEN LIMITED RIGHT UPPER QUADRANT COMPARISON:  None. FINDINGS: Gallbladder: No gallstones or wall thickening visualized (2.2 mm). No sonographic Murphy sign noted by sonographer. Common bile duct: Diameter: 6.5 mm Liver: No focal lesion identified. Within normal limits in parenchymal echogenicity. Portal vein is patent on color Doppler imaging with normal direction of blood flow towards the liver. Other: It should be noted that the study is limited secondary to overlying bowel gas. IMPRESSION: 1. Limited study secondary to overlying bowel gas. 2. Otherwise unremarkable right upper quadrant ultrasound. Electronically Signed   By:  Virgina Norfolk M.D.   On: 04/01/2020 21:01     LOS: 1 day   Time spent: More than 50% of that time was spent in counseling and/or coordination of care.  Antonieta Pert, MD [facility]  04/02/2020, 10:23 AM

## 2020-04-02 NOTE — Progress Notes (Signed)
mNIHSS perform by Aris Lot, RN without any difficulty from pt. mNIHSS score unchanged

## 2020-04-03 ENCOUNTER — Encounter (HOSPITAL_COMMUNITY): Payer: Self-pay | Admitting: Internal Medicine

## 2020-04-03 ENCOUNTER — Inpatient Hospital Stay (HOSPITAL_COMMUNITY): Payer: Medicare Other

## 2020-04-03 DIAGNOSIS — I6389 Other cerebral infarction: Secondary | ICD-10-CM

## 2020-04-03 DIAGNOSIS — K851 Biliary acute pancreatitis without necrosis or infection: Principal | ICD-10-CM

## 2020-04-03 DIAGNOSIS — R945 Abnormal results of liver function studies: Secondary | ICD-10-CM

## 2020-04-03 LAB — CBC WITH DIFFERENTIAL/PLATELET
Abs Immature Granulocytes: 0.1 10*3/uL — ABNORMAL HIGH (ref 0.00–0.07)
Basophils Absolute: 0.1 10*3/uL (ref 0.0–0.1)
Basophils Relative: 1 %
Eosinophils Absolute: 0.2 10*3/uL (ref 0.0–0.5)
Eosinophils Relative: 4 %
HCT: 29.4 % — ABNORMAL LOW (ref 36.0–46.0)
Hemoglobin: 9.4 g/dL — ABNORMAL LOW (ref 12.0–15.0)
Lymphocytes Relative: 11 %
Lymphs Abs: 0.6 10*3/uL — ABNORMAL LOW (ref 0.7–4.0)
MCH: 21.8 pg — ABNORMAL LOW (ref 26.0–34.0)
MCHC: 32 g/dL (ref 30.0–36.0)
MCV: 68.2 fL — ABNORMAL LOW (ref 80.0–100.0)
Monocytes Absolute: 0.2 10*3/uL (ref 0.1–1.0)
Monocytes Relative: 4 %
Myelocytes: 1 %
Neutro Abs: 4.3 10*3/uL (ref 1.7–7.7)
Neutrophils Relative %: 79 %
Platelets: 195 10*3/uL (ref 150–400)
RBC: 4.31 MIL/uL (ref 3.87–5.11)
RDW: 14.6 % (ref 11.5–15.5)
WBC: 5.5 10*3/uL (ref 4.0–10.5)
nRBC: 0 % (ref 0.0–0.2)
nRBC: 0 /100 WBC

## 2020-04-03 LAB — COMPREHENSIVE METABOLIC PANEL
ALT: 238 U/L — ABNORMAL HIGH (ref 0–44)
AST: 165 U/L — ABNORMAL HIGH (ref 15–41)
Albumin: 2.7 g/dL — ABNORMAL LOW (ref 3.5–5.0)
Alkaline Phosphatase: 113 U/L (ref 38–126)
Anion gap: 8 (ref 5–15)
BUN: 8 mg/dL (ref 8–23)
CO2: 22 mmol/L (ref 22–32)
Calcium: 8.3 mg/dL — ABNORMAL LOW (ref 8.9–10.3)
Chloride: 101 mmol/L (ref 98–111)
Creatinine, Ser: 0.53 mg/dL (ref 0.44–1.00)
GFR calc Af Amer: >60 mL/min (ref >60)
GFR calc non Af Amer: >60 mL/min (ref >60)
Glucose, Bld: 84 mg/dL (ref 70–99)
Potassium: 3.5 mmol/L (ref 3.5–5.1)
Sodium: 131 mmol/L — ABNORMAL LOW (ref 135–145)
Total Bilirubin: 0.7 mg/dL (ref 0.3–1.2)
Total Protein: 5.4 g/dL — ABNORMAL LOW (ref 6.5–8.1)

## 2020-04-03 LAB — LIPID PANEL
Cholesterol: 149 mg/dL (ref 0–200)
HDL: 68 mg/dL (ref >40)
LDL Cholesterol: 72 mg/dL (ref 0–99)
Total CHOL/HDL Ratio: 2.2 RATIO
Triglycerides: 45 mg/dL (ref <150)
VLDL: 9 mg/dL (ref 0–40)

## 2020-04-03 LAB — HEMOGLOBIN A1C
Hgb A1c MFr Bld: 5.4 % (ref 4.8–5.6)
Mean Plasma Glucose: 108.28 mg/dL

## 2020-04-03 LAB — LIPASE, BLOOD: Lipase: 81 U/L — ABNORMAL HIGH (ref 11–51)

## 2020-04-03 LAB — ECHOCARDIOGRAM COMPLETE
Height: 62 in
Weight: 2052.92 oz

## 2020-04-03 LAB — TRIGLYCERIDES: Triglycerides: 42 mg/dL (ref <150)

## 2020-04-03 MED ORDER — CHLORHEXIDINE GLUCONATE CLOTH 2 % EX PADS
6.0000 | MEDICATED_PAD | Freq: Every day | CUTANEOUS | Status: DC
  Administered 2020-04-03 – 2020-04-04 (×2): 6 via TOPICAL

## 2020-04-03 MED ORDER — ADULT MULTIVITAMIN W/MINERALS CH
1.0000 | ORAL_TABLET | Freq: Every day | ORAL | Status: DC
  Administered 2020-04-04 – 2020-04-05 (×2): 1 via ORAL
  Filled 2020-04-03 (×2): qty 1

## 2020-04-03 MED ORDER — LEVOTHYROXINE SODIUM 50 MCG PO TABS
50.0000 ug | ORAL_TABLET | Freq: Every day | ORAL | Status: DC
  Administered 2020-04-04 – 2020-04-05 (×2): 50 ug via ORAL
  Filled 2020-04-03 (×2): qty 1

## 2020-04-03 MED ORDER — FLUTICASONE PROPIONATE 50 MCG/ACT NA SUSP
2.0000 | Freq: Two times a day (BID) | NASAL | Status: DC
  Administered 2020-04-03 – 2020-04-05 (×5): 2 via NASAL
  Filled 2020-04-03: qty 16

## 2020-04-03 MED ORDER — FEXOFENADINE HCL 60 MG PO TABS
180.0000 mg | ORAL_TABLET | Freq: Every day | ORAL | Status: DC
  Filled 2020-04-03: qty 3
  Filled 2020-04-03: qty 1

## 2020-04-03 MED ORDER — IOHEXOL 350 MG/ML SOLN
50.0000 mL | Freq: Once | INTRAVENOUS | Status: AC | PRN
  Administered 2020-04-03: 50 mL via INTRAVENOUS

## 2020-04-03 MED ORDER — STROKE: EARLY STAGES OF RECOVERY BOOK: Freq: Once | Status: AC

## 2020-04-03 MED ORDER — MULTIVITAMIN GUMMIES WOMENS PO CHEW: 2.0000 | CHEWABLE_TABLET | Freq: Every day | ORAL | Status: DC

## 2020-04-03 MED ORDER — MIRABEGRON ER 25 MG PO TB24
25.0000 mg | ORAL_TABLET | Freq: Every day | ORAL | Status: DC
  Administered 2020-04-04 – 2020-04-05 (×2): 25 mg via ORAL
  Filled 2020-04-03 (×3): qty 1

## 2020-04-03 MED ORDER — TEMAZEPAM 15 MG PO CAPS
15.0000 mg | ORAL_CAPSULE | Freq: Every day | ORAL | Status: DC
  Administered 2020-04-03 – 2020-04-04 (×2): 15 mg via ORAL
  Filled 2020-04-03 (×2): qty 1

## 2020-04-03 MED ORDER — OXYCODONE HCL 5 MG PO TABS: 5.0000 mg | ORAL_TABLET | Freq: Two times a day (BID) | ORAL | Status: DC | PRN

## 2020-04-03 MED ORDER — DOCUSATE SODIUM 100 MG PO CAPS
100.0000 mg | ORAL_CAPSULE | Freq: Every day | ORAL | Status: DC
  Administered 2020-04-04: 100 mg via ORAL
  Filled 2020-04-03: qty 1

## 2020-04-03 MED ORDER — GABAPENTIN 300 MG PO CAPS
300.0000 mg | ORAL_CAPSULE | Freq: Every day | ORAL | Status: DC
  Administered 2020-04-03 – 2020-04-04 (×2): 300 mg via ORAL
  Filled 2020-04-03 (×2): qty 1

## 2020-04-03 MED ORDER — OMEGA-3-ACID ETHYL ESTERS 1 G PO CAPS
1.0000 g | ORAL_CAPSULE | Freq: Every day | ORAL | Status: DC
  Administered 2020-04-04 – 2020-04-05 (×2): 1 g via ORAL
  Filled 2020-04-03 (×2): qty 1

## 2020-04-03 MED ORDER — MUPIROCIN 2 % EX OINT
1.0000 "application " | TOPICAL_OINTMENT | Freq: Two times a day (BID) | CUTANEOUS | Status: DC
  Administered 2020-04-03 – 2020-04-05 (×5): 1 via NASAL
  Filled 2020-04-03: qty 22

## 2020-04-03 NOTE — Progress Notes (Addendum)
Verified with Stroke Team regarding 24 hour TPA scan.  CTA ordered, MRI will be order by Stroke Team.  Per Stroke team, CTA and MRI does not need to be completed before 24 hour post TPA.

## 2020-04-03 NOTE — Progress Notes (Signed)
PROGRESS NOTE    Meghan Kim  PJA:[id number] DOB: 03-Mar-1926 DOA: 04/01/2020 PCP: Health, [facility]   Brief Narrative:   86 y.o. female with medical history significant for hypertension, hypothyroidism, arthritis, and neuropathy [facility] ER on 5/8 with complaint of pain in the lower chest and upper abdomen after having her lunch.  She was seen in the ED was hypertensive 200/90, tachycardic, with first-degree AV nodal block, chest x-ray no acute finding, troponin negative x2 blood work showed hyponatremia sodium 123 abnormal LFTs AST 1342, ALT 596, and lipase 1575. leukocytosis to 18,800.  Covid and influenza PCR were negative. RUQ [place]- CBD diameter of 6.5 and otherwise unremarkable though limited by overlying bowel gas.  CT the abdomen pelvis reveals significantly distended gallbladder with pericholecystic inflammatory changes and significant intra and extrahepatic biliary ductal dilatation concerning for acute cholecystitis or choledocholithiasis.  Also noted on CT is inflammatory changes about the head of the pancreas.  Patient was given a liter of lactated Ringer's, Zofran, Rocephin, Flagyl, and multiple doses of IV fentanyl in the ED. GI was contacted and patient was admitted to [facility]. /9/20 9 AM patient had episode where she had word finding difficulties underwent code stroke protocol seen by neurology CT head no acute finding.  Subjective: Seen this morning son at the bedside.  On the way for CTA this morning Reports abdominal pain is much improved, no nausea vomiting. Overnight afebrile T-max 98.8, hemodynamically stable saturating on room air. Pt moved to 4N after code stroke protocal. Refused NIH scale  This am  Assessment & Plan:  Abdominal discomfort with elevated LFTs/elevated lipase: suspected acute cholecystitis/biliary pancreatitis: Patient underwent MRCP that showed mild acute pancreatitis, no fluid collection, common hepatic and  common bile duct dilation without obstruction, no choledocholithiasis, suspecting chronic biliary duct dilatation and potential dilation related to pancreatitis, no gallstones and no gallbladder wall thickening, has a small amount of pericholecystic fluid which is favored related to pancreatic inflammation.  Unclear etiology, no alcohol use, no gallstone, TG normal.  GI on board.  Clinically much improved, LFTs lipase significantly down as below. On empiric Zosyn AND OKAY TO D/C since low suspicion of acute cholecystitis. She is tolerating diet.Cont  IV fluids, nausea medication and pain management Recent Labs  Lab 04/01/20 1923 04/02/20 0319 04/03/20 0642  AST 1,342* 736* 165*  ALT 596* 565* 238*  ALKPHOS 188* 160* 113  BILITOT 1.1 1.1 0.7  PROT 6.8 6.0* 5.4*  ALBUMIN 3.8 3.2* 2.7*   Code stroke due to  expressive aphasia/word finding difficulty noted at 9:15 AM last known normal 7.30 am on 04/02/20: Status post code stroke and TPA by neurology.Appreciate neurology input, CT head showed no evidence of acute intracranial abnormality. Cont plan as per neuro-with  24 hr repeat repeat CT  Head. CTangio head and neck, MRI brain, T OT eval.  Passed bedside swallow.  Hypothyroidism: cont p.o. Synthroid once able to take orally.  Hypertension: Blood pressure is fairly controlled.  Holding home Lasix, valsartan and lisinopril.  Can use as needed IV medication if needed. palced on cleviprex  Drip by neuro  Chronic hyponatremia, 123 on admission slightly lower than recent prior labs, overall euvolemic, she is on IV fluids sodium is significantly up. Recent Labs  Lab 04/01/20 1923 04/02/20 0319 04/03/20 0642  NA 123* 129* 131*   DVT prophylaxis:SCD. Holding Heparin/lovenox incase she needs procedure-if not will start. Code Status:DNR Family Communication: plan of care discussed with patient and son  at bedside.  Status is: Inpatient Remains inpatient appropriate because:Ongoing diagnostic testing  needed not appropriate for outpatient work up and IV treatments appropriate due to intensity of illness or inability to take PO  Dispo: The patient is from: Home              Anticipated d/c is to: Home vs SNF- PT eval.              Anticipated d/c date is: 2 days              Patient currently is not medically stable to d/c.  Nutrition: Diet Order            Diet regular Room service appropriate? Yes with Assist; Fluid consistency: Thin  Diet effective now              Body mass index is 23.47 kg/m. Consultants:see note  Procedures:  MRCP 04/02/20 1. Findings most suggestive of mild acute pancreatitis. No organized fluid collections. 2. Dilatation of the common hepatic duct and common bile duct without obstructing lesion identified. No choledocholithiasis. Favor combination of chronic biliary duct dilatation and potential dilatation related to pancreatitis. 3. No gallstones present. No gallbladder wall thickening. Small amount of Peri cholecystic fluid is favored related to pancreatic inflammation.  Microbiology:see note  Medications: Scheduled Meds: .  stroke: mapping our early stages of recovery book   Does not apply Once  . Chlorhexidine Gluconate Cloth  6 each Topical Q0600  . mupirocin ointment  1 application Nasal BID  . sodium chloride flush  3 mL Intravenous Q12H   Continuous Infusions:   Antimicrobials: Anti-infectives (From admission, onward)   Start     Dose/Rate Route Frequency Ordered Stop   04/02/20 0400  piperacillin-tazobactam (ZOSYN) IVPB 3.375 g  Status:  Discontinued     3.375 g 12.5 mL/hr over 240 Minutes Intravenous Every 8 hours 04/02/20 0243 04/03/20 1045   04/01/20 2345  metroNIDAZOLE (FLAGYL) IVPB 500 mg     500 mg 100 mL/hr over 60 Minutes Intravenous  Once 04/01/20 2340 04/02/20 0134   04/01/20 2300  cefTRIAXone (ROCEPHIN) 1 g in sodium chloride 0.9 % 100 mL IVPB     1 g 200 mL/hr over 30 Minutes Intravenous  Once 04/01/20 2257 04/01/20 2358        Objective: Vitals: Today's Vitals   04/03/20 0600 04/03/20 0700 04/03/20 0800 04/03/20 0900  BP: (!) 137/55 (!) 125/46 (!) 147/63 (!) 146/67  Pulse: 60 68 79 79  Resp: (!) 21 16 (!) 22 20  Temp:   98.8 F (37.1 C)   TempSrc:      SpO2: 100% 100% 97% 94%  Weight:      Height:      PainSc:   0-No pain     Intake/Output Summary (Last 24 hours) at 04/03/2020 1128 Last data filed at 04/03/2020 0900 Gross per 24 hour  Intake 279.36 ml  Output 500 ml  Net -220.64 ml   Filed Weights   04/01/20 1855 04/02/20 0227 04/02/20 1115  Weight: 54.4 kg 59.4 kg 58.2 kg   Weight change: 3.768 kg   Intake/Output from previous day: 05/09 0701 - 05/10 0700 In: 353.5 [I.V.:197.1; IV Piggyback:156.4] Out: 550 [Urine:550] Intake/Output this shift: Total I/O In: 24.3 [IV Piggyback:24.3] Out: -   Examination:  General exam: AA oriented at baseline hard of hearing, HEENT:Oral mucosa moist, Ear/Nose WNL grossly,dentition normal. Respiratory system: bilaterally clear,no wheezing or crackles,no use of accessory muscle, non tender. Cardiovascular  system: S1 & S2 +, regular, No JVD. Gastrointestinal system: Abdomen soft, minimal tenderness on central abdomen Nervous System:Alert, awake, moving extremities and grossly nonfocal Extremities: No edema, distal peripheral pulses palpable.  Skin: No rashes,no icterus. MSK: Normal muscle bulk,tone, power  Data Reviewed: I have personally reviewed following labs and imaging studies CBC: Recent Labs  Lab 04/01/20 1923 04/02/20 0319 04/03/20 0642  WBC 11.8* 10.5 5.5  NEUTROABS 10.6* 8.9* 4.3  HGB 11.6* 11.1* 9.4*  HCT 36.4 34.5* 29.4*  MCV 68.0* 68.3* 68.2*  PLT 242 225 195   Basic Metabolic Panel: Recent Labs  Lab 04/01/20 1923 04/02/20 0319 04/03/20 0642  NA 123* 129* 131*  K 4.3 4.2 3.5  CL 92* 96* 101  CO2 21* 23 22  GLUCOSE 141* 110* 84  BUN 12 7* 8  CREATININE 0.39* 0.42* 0.53  CALCIUM 9.0 8.8* 8.3*   GFR: Estimated  Creatinine Clearance: 36.2 mL/min (by C-G formula based on SCr of 0.53 mg/dL). Liver Function Tests: Recent Labs  Lab 04/01/20 1923 04/02/20 0319 04/03/20 0642  AST 1,342* 736* 165*  ALT 596* 565* 238*  ALKPHOS 188* 160* 113  BILITOT 1.1 1.1 0.7  PROT 6.8 6.0* 5.4*  ALBUMIN 3.8 3.2* 2.7*   Recent Labs  Lab 04/01/20 1923 04/02/20 0319 04/03/20 0642  LIPASE 1,575* 896* 81*   No results for input(s): AMMONIA in the last 168 hours. Coagulation Profile: No results for input(s): INR, PROTIME in the last 168 hours. Cardiac Enzymes: No results for input(s): CKTOTAL, CKMB, CKMBINDEX, TROPONINI in the last 168 hours. BNP (last 3 results) No results for input(s): PROBNP in the last 8760 hours. HbA1C: No results for input(s): HGBA1C in the last 72 hours. CBG: Recent Labs  Lab 04/02/20 0959  GLUCAP 83   Lipid Profile: Recent Labs    04/03/20 0642  TRIG 42   Thyroid Function Tests: No results for input(s): TSH, T4TOTAL, FREET4, T3FREE, THYROIDAB in the last 72 hours. Anemia Panel: No results for input(s): VITAMINB12, FOLATE, FERRITIN, TIBC, IRON, RETICCTPCT in the last 72 hours. Sepsis Labs: No results for input(s): PROCALCITON, LATICACIDVEN in the last 168 hours.  Recent Results (from the past 240 hour(s))  Respiratory Panel by RT PCR (Flu A&B, Covid) - Nasopharyngeal Swab     Status: None   Collection Time: 04/01/20 10:08 PM   Specimen: Nasopharyngeal Swab  Result Value Ref Range Status   SARS Coronavirus 2 by RT PCR NEGATIVE NEGATIVE Final    Comment: (NOTE) SARS-CoV-2 target nucleic acids are NOT DETECTED. The SARS-CoV-2 RNA is generally detectable in upper respiratoy specimens during the acute phase of infection. The lowest concentration of SARS-CoV-2 viral copies this assay can detect is 131 copies/mL. A negative result does not preclude SARS-Cov-2 infection and should not be used as the sole basis for treatment or other patient management decisions. A negative  result may occur with  improper specimen collection/handling, submission of specimen other than nasopharyngeal swab, presence of viral mutation(s) within the areas targeted by this assay, and inadequate number of viral copies (<131 copies/mL). A negative result must be combined with clinical observations, patient history, and epidemiological information. The expected result is Negative. Fact Sheet for Patients:  https://www.moore.com/ Fact Sheet for Healthcare Providers:  https://www.young.biz/ This test is not yet ap proved or cleared by the [place] FDA and  has been authorized for detection and/or diagnosis of SARS-CoV-2 by FDA under an Emergency Use Authorization (EUA). This EUA will remain  in effect (meaning this test can  be used) for the duration of the COVID-19 declaration under Section 564(b)(1) of the Act, 21 U.S.C. section 360bbb-3(b)(1), unless the authorization is terminated or revoked sooner.    Influenza A by PCR NEGATIVE NEGATIVE Final   Influenza B by PCR NEGATIVE NEGATIVE Final    Comment: (NOTE) The Xpert Xpress SARS-CoV-2/FLU/RSV assay is intended as an aid in  the diagnosis of influenza from Nasopharyngeal swab specimens and  should not be used as a sole basis for treatment. Nasal washings and  aspirates are unacceptable for Xpert Xpress SARS-CoV-2/FLU/RSV  testing. Fact Sheet for Patients: https://www.moore.com/ Fact Sheet for Healthcare Providers: https://www.young.biz/ This test is not yet approved or cleared by the [place] FDA and  has been authorized for detection and/or diagnosis of SARS-CoV-2 by  FDA under an Emergency Use Authorization (EUA). This EUA will remain  in effect (meaning this test can be used) for the duration of the  Covid-19 declaration under Section 564(b)(1) of the Act, 21  U.S.C. section 360bbb-3(b)(1), unless the authorization is  terminated or  revoked. Performed at [facility], [address] [place], [place] [address]   MRSA PCR Screening     Status: Abnormal   Collection Time: 04/02/20 12:13 PM   Specimen: Nasopharyngeal  Result Value Ref Range Status   MRSA by PCR POSITIVE (A) NEGATIVE Final    Comment:        The GeneXpert MRSA Assay (FDA approved for NASAL specimens only), is one component of a comprehensive MRSA colonization surveillance program. It is not intended to diagnose MRSA infection nor to guide or monitor treatment for MRSA infections. RESULT CALLED TO, READ BACK BY AND VERIFIED WITH: RN Antonieta Loveless [id number] 04/02/20 KB       Radiology Studies: CT ABDOMEN PELVIS W CONTRAST  Result Date: 04/01/2020 CLINICAL DATA:  Acute abdominal pain. EXAM: CT ABDOMEN AND PELVIS WITH CONTRAST TECHNIQUE: Multidetector CT imaging of the abdomen and pelvis was performed using the standard protocol following bolus administration of intravenous contrast. CONTRAST:  51mL OMNIPAQUE IOHEXOL 300 MG/ML  SOLN COMPARISON:  February 11, 2017. FINDINGS: Lower chest: The lung bases are clear. The heart size is normal. Hepatobiliary: The liver is normal. The gallbladder is significantly distended. There are pericholecystic inflammatory changes.There is significant intrahepatic and extrahepatic biliary ductal dilatation. The common bile duct measures up to approximately 1.4 cm in diameter. Pancreas: The pancreas is atrophic. There are mild inflammatory changes near the head of the pancreas. Spleen: Unremarkable. Adrenals/Urinary Tract: --Adrenal glands: Unremarkable. --Right kidney/ureter: No hydronephrosis or radiopaque kidney stones. --Left kidney/ureter: No hydronephrosis or radiopaque kidney stones. --Urinary bladder: The urinary bladder is significantly distended. Stomach/Bowel: --Stomach/Duodenum: No hiatal hernia or other gastric abnormality. Normal duodenal course and caliber. --Small bowel: There are inflammatory changes  surrounding the third and fourth portions of the duodenum. Duodenal diverticula are noted. --Colon: Rectosigmoid diverticulosis without acute inflammation. --Appendix: Normal. Vascular/Lymphatic: Atherosclerotic calcification is present within the non-aneurysmal abdominal aorta, without hemodynamically significant stenosis. Heavy calcifications are noted in the mid to distal aorta likely resulting in some mild to moderate stenosis. --No retroperitoneal lymphadenopathy. --No mesenteric lymphadenopathy. --No pelvic or inguinal lymphadenopathy. Reproductive: There prominent pelvic veins bilaterally. Other: No ascites or free air. The abdominal wall is normal. Musculoskeletal. There is an age-indeterminate compression fracture of the L4 vertebral body, new since 2018. Multilevel degenerative changes are noted throughout the lumbar spine. There is an old fracture of the inferior pubic ramus on the right with evidence for nonunion. There are  old fractures involving the right superior pubic ramus and anterior column of the right acetabulum. Bilateral sacral fractures are noted status post bilateral sacral fixation. The patient is status post prior intramedullary nail placement through the left femur. There are end-stage degenerative changes of the left hip. There are moderate degenerative changes of the right hip. IMPRESSION: 1. Significantly distended gallbladder with pericholecystic inflammatory changes and significant intrahepatic and extrahepatic biliary ductal dilatation. Findings are concerning for acute cholecystitis or choledocholithiasis in the appropriate clinical setting. Consider further evaluation with ultrasound and MRCP as clinically indicated. 2. Inflammatory changes surrounding the head of the pancreas and duodenum. Findings may be secondary to acute interstitial pancreatitis or duodenitis. Correlation with laboratory studies is recommended. 3. Rectosigmoid diverticulosis without acute inflammation. 4.  Age-indeterminate compression fracture of the L4 vertebral body, new since 2018. 5. Old pelvic fractures as above. Aortic Atherosclerosis (ICD10-I70.0). Electronically Signed   By: Constance Holster M.D.   On: 04/01/2020 22:50   DG Chest Port 1 View  Result Date: 04/01/2020 CLINICAL DATA:  Chest discomfort since lunch, previous tobacco abuse EXAM: PORTABLE CHEST 1 VIEW COMPARISON:  08/19/2018 FINDINGS: Single frontal view of the chest demonstrates an unremarkable cardiac silhouette. Background interstitial prominence consistent with history of tobacco abuse. No airspace disease, effusion, or pneumothorax. No acute bony abnormalities. IMPRESSION: 1. No acute intrathoracic process. Electronically Signed   By: Randa Ngo M.D.   On: 04/01/2020 20:04   MR ABDOMEN MRCP W WO CONTAST  Result Date: 04/02/2020 CLINICAL DATA:  Pancreatitis suspected. Concern for choledocholithiasis. EXAM: MRI ABDOMEN WITHOUT AND WITH CONTRAST (INCLUDING MRCP) TECHNIQUE: Multiplanar multisequence MR imaging of the abdomen was performed both before and after the administration of intravenous contrast. Heavily T2-weighted images of the biliary and pancreatic ducts were obtained, and three-dimensional MRCP images were rendered by post processing. CONTRAST:  5.74mL GADAVIST GADOBUTROL 1 MMOL/ML IV SOLN COMPARISON:  CT 04/01/2020 FINDINGS: Lower chest:  Lung bases are clear. Hepatobiliary: No intrahepatic biliary duct dilatation. The common hepatic duct measures 12 mm. The common bile duct measures 8 mm. There is no filling defect within the common bile duct. The duct dilatation extends to the ampulla. The gallbladder is elongated and mildly distended to 4.0 cm. No gallbladder wall thickening. No gallstones noted. Small amount pericholecystic fluid (image 20/5). Pancreas: Pancreatic duct is normal caliber. No variant pancreatic ductal anatomy. There is edema within the pancreatic parenchyma. This edema is more concentrated towards the head  of the pancreas (image 18/11). There is small amount of fluid in Morrison's pouch and along the second portion duodenum (image 19/5). No organized fluid collections. Spleen: Normal spleen. Adrenals/urinary tract: Adrenal glands and kidneys are normal. Stomach/Bowel: Stomach and limited of the small bowel is unremarkable Vascular/Lymphatic: Abdominal aortic normal caliber. No retroperitoneal periportal lymphadenopathy. Musculoskeletal: No aggressive osseous lesion IMPRESSION: 1. Findings most suggestive of mild acute pancreatitis. No organized fluid collections. 2. Dilatation of the common hepatic duct and common bile duct without obstructing lesion identified. No choledocholithiasis. Favor combination of chronic biliary duct dilatation and potential dilatation related to pancreatitis. 3. No gallstones present. No gallbladder wall thickening. Small amount of Peri cholecystic fluid is favored related to pancreatic inflammation. Electronically Signed   By: Suzy Bouchard M.D.   On: 04/02/2020 08:20   CT HEAD CODE STROKE WO CONTRAST  Result Date: 04/02/2020 CLINICAL DATA:  Code stroke.  Additional history provided: Aphasia. EXAM: CT HEAD WITHOUT CONTRAST TECHNIQUE: Contiguous axial images were obtained from the base of the skull through the  vertex without intravenous contrast. COMPARISON:  Brain MRI 10/13/2018 FINDINGS: Brain: There is advanced ill-defined hypoattenuation within cerebral white matter which is nonspecific, but consistent with chronic small vessel ischemic disease. Chronic left thalamic lacunar infarct. Known chronic lacunar infarcts within the cerebellum were better appreciated on prior MRI 10/13/2018. There is no acute intracranial hemorrhage. No demarcated cortical infarct. No extra-axial fluid collection. No evidence of intracranial mass. No midline shift. Vascular: No hyperdense vessel.  Atherosclerotic calcifications. Skull: Normal. Negative for fracture or focal lesion. Sinuses/Orbits:  Visualized orbits show no acute finding. No significant paranasal sinus disease at the imaged levels. Small bilateral mastoid effusions. ASPECTS ([place] Stroke Program Early CT Score) - Ganglionic level infarction (caudate, lentiform nuclei, internal capsule, insula, M1-M3 cortex): 7 - Supraganglionic infarction (M4-M6 cortex): 3 Total score (0-10 with 10 being normal): 10 These results were communicated to Dr. Otelia Limes At 10:34 amon 5/9/2021by text page via the [facility] messaging system. IMPRESSION: 1. No evidence of acute intracranial abnormality. ASPECTS is 10. 2. Advanced chronic small vessel ischemic changes within the cerebral white matter. Chronic lacunar infarcts within the left thalamus and within the cerebellum. 3. Moderate generalized parenchymal atrophy. 4. Small bilateral mastoid effusions. Electronically Signed   By: Jackey Loge DO   On: 04/02/2020 10:35   US Abdomen Limited RUQ  Result Date: 04/01/2020 CLINICAL DATA:  Postprandial abdominal pain. EXAM: ULTRASOUND ABDOMEN LIMITED RIGHT UPPER QUADRANT COMPARISON:  None. FINDINGS: Gallbladder: No gallstones or wall thickening visualized (2.2 mm). No sonographic Murphy sign noted by sonographer. Common bile duct: Diameter: 6.5 mm Liver: No focal lesion identified. Within normal limits in parenchymal echogenicity. Portal vein is patent on color Doppler imaging with normal direction of blood flow towards the liver. Other: It should be noted that the study is limited secondary to overlying bowel gas. IMPRESSION: 1. Limited study secondary to overlying bowel gas. 2. Otherwise unremarkable right upper quadrant ultrasound. Electronically Signed   By: Aram Candela M.D.   On: 04/01/2020 21:01     LOS: 2 days   Time spent: More than 50% of that time was spent in counseling and/or coordination of care.  Lanae Boast, MD [facility]  04/03/2020, 11:28 AM

## 2020-04-03 NOTE — Progress Notes (Signed)
STROKE TEAM PROGRESS NOTE   INTERVAL HISTORY I personally reviewed history of presenting illness, electronic medical records and imaging films in PACS.  She presented with acute abdominal and lower chest pain secondary to pancreatitis.  She developed sudden onset of aphasia and speech difficulties and was given IV TPA and has responded well and made complete recovery.  Post TPA imaging and stroke work-up is pending.  Vital signs are stable.  Blood pressure adequately controlled.  Vitals:   04/03/20 0500 04/03/20 0600 04/03/20 0700 04/03/20 0800  BP: (!) 114/40 (!) 137/55 (!) 125/46 (!) 147/63  Pulse: 68 60 68 79  Resp: 16 (!) 21 16 (!) 22  Temp:    98.8 F (37.1 C)  TempSrc:      SpO2: 99% 100% 100% 97%  Weight:      Height:        CBC:  Recent Labs  Lab 04/02/20 0319 04/03/20 0642  WBC 10.5 5.5  NEUTROABS 8.9* PENDING  HGB 11.1* 9.4*  HCT 34.5* 29.4*  MCV 68.3* 68.2*  PLT 225 008    Basic Metabolic Panel:  Recent Labs  Lab 04/01/20 1923 04/02/20 0319  NA 123* 129*  K 4.3 4.2  CL 92* 96*  CO2 21* 23  GLUCOSE 141* 110*  BUN 12 7*  CREATININE 0.39* 0.42*  CALCIUM 9.0 8.8*   Lipid Panel: No results found for: CHOL, TRIG, HDL, CHOLHDL, VLDL, LDLCALC HgbA1c: No results found for: HGBA1C Urine Drug Screen: No results found for: LABOPIA, COCAINSCRNUR, LABBENZ, AMPHETMU, THCU, LABBARB  Alcohol Level No results found for: ETH  IMAGING past 24 hours CT HEAD CODE STROKE WO CONTRAST  Result Date: 04/02/2020 CLINICAL DATA:  Code stroke.  Additional history provided: Aphasia. EXAM: CT HEAD WITHOUT CONTRAST TECHNIQUE: Contiguous axial images were obtained from the base of the skull through the vertex without intravenous contrast. COMPARISON:  Brain MRI 10/13/2018 FINDINGS: Brain: There is advanced ill-defined hypoattenuation within cerebral white matter which is nonspecific, but consistent with chronic small vessel ischemic disease. Chronic left thalamic lacunar infarct. Known  chronic lacunar infarcts within the cerebellum were better appreciated on prior MRI 10/13/2018. There is no acute intracranial hemorrhage. No demarcated cortical infarct. No extra-axial fluid collection. No evidence of intracranial mass. No midline shift. Vascular: No hyperdense vessel.  Atherosclerotic calcifications. Skull: Normal. Negative for fracture or focal lesion. Sinuses/Orbits: Visualized orbits show no acute finding. No significant paranasal sinus disease at the imaged levels. Small bilateral mastoid effusions. ASPECTS ([place]anderson Stroke Program Early CT Score) - Ganglionic level infarction (caudate, lentiform nuclei, internal capsule, insula, M1-M3 cortex): 7 - Supraganglionic infarction (M4-M6 cortex): 3 Total score (0-10 with 10 being normal): 10 These results were communicated to Dr. Cheral Marker At 10:34 amon 5/9/2021by text page via the [facility] messaging system. IMPRESSION: 1. No evidence of acute intracranial abnormality. ASPECTS is 10. 2. Advanced chronic small vessel ischemic changes within the cerebral white matter. Chronic lacunar infarcts within the left thalamus and within the cerebellum. 3. Moderate generalized parenchymal atrophy. 4. Small bilateral mastoid effusions. Electronically Signed   By: Kellie Simmering DO   On: 04/02/2020 10:35    PHYSICAL EXAM Pleasant elderly Caucasian lady not in distress.  She is hard of hearing. . Afebrile. Head is nontraumatic. Neck is supple without bruit.    Cardiac exam no murmur or gallop. Lungs are clear to auscultation. Distal pulses are well felt. Neurological Exam ;  Awake  Alert oriented x 3. Normal speech and language.eye movements full without nystagmus.fundi were  not visualized. Vision acuity and fields appear normal. Hearing is normal. Palatal movements are normal. Face symmetric. Tongue midline. Normal strength, tone, reflexes and coordination. Normal sensation. Gait deferred.  ASSESSMENT/PLAN [name] is a 86 y.o. female with history  of HTN, neuropathy, thyroid dz  presenting to [facility] with chest and abdominal pain after eating lunch. Elevated LFTs, likely cholecystitis or choledocholithisis. The following am developed expressive aphasia and workd finding fiddiculties. Received IV tPA 04/02/20 at 1048. Admitted to neuro ICU.    Stroke:   Stroke-like sx s/p IV tPA, workup underway  Code Stroke CT head No acute abnormality. Small vessel disease. Atrophy. Chronic L thalamic and cerebellar lacunes. Sinus dz. ASPECTS 10.     CTA head & neck negative for large vessel occlusion.  Bilateral calcified carotid siphon plaque with moderate right and mild left-sided carotid stenosis.  MRI no acute infarct.  Changes of age-related small vessel disease.  2D Echo pending  LDL 72 mg   HgbA1c 5.4  SCDs for VTE prophylaxis  No antithrombotic prior to admission, now on No antithrombotic. Add aspirin 81 if imaging neg for hemorrhage     Therapy recommendations:  Pending  Disposition:  pending   Ok to be OOB  Ok for transfer to the floor  Hypertension  Home meds:  None listed  Stable BP goal per post tPA protocol x 24h following tPA administration then < 180 . Long-term BP goal normotensive  Hyperlipidemia  Home meds:  Fish oil  LDL pending, goal < 70  Hold statin considering elevated LFTs  Other Stroke Risk Factors  Advanced age  Former Cigarette smoker  ETOH use, advised to drink no more than 1 drink(s) a day  Other Active Problems  Mild gallstone pancreatitis. Elevated LFTs AST/ALT/ALP 736/565/160->165/238/113 - monitor. WBC normalized. On Zosyn 5/9>>5/10. Tolerating regular diet. GI onboard [employer])    Hypothyroidism on synthroid PTA   Chronic hyponatremia 123-129 - monitor   Anemia 11.6-11.1-9.4 - monitor  Hospital day # 2 She presented with acute pancreatitis and then developed sudden onset of expressive aphasia and speech difficulties likely due to left hemispheric TIA and received IV  TPA with excellent clinical recovery.  Continue close blood pressure monitoring and neurological observation as per post TPA protocol.  Mobilize out of bed.  Check echocardiogram, lipid profile hemoglobin A1c.  Therapy consults.  Medical management for pancreatitis as per primary team.  Long discussion with patient and son at the bedside and answered questions.  Discussed with Dr. Dayna Barker This patient is critically ill and at significant risk of neurological worsening, death and care requires constant monitoring of vital signs, hemodynamics,respiratory and cardiac monitoring, extensive review of multiple databases, frequent neurological assessment, discussion with family, other specialists and medical decision making of high complexity.I have made any additions or clarifications directly to the above note.This critical care time does not reflect procedure time, or teaching time or supervisory time of PA/NP/Med Resident etc but could involve care discussion time.  I spent 30 minutes of neurocritical care time  in the care of  this patient.    Delia Heady, MD  To contact Stroke Continuity provider, please refer to [email or web address]. After hours, contact General Neurology

## 2020-04-03 NOTE — Progress Notes (Signed)
  Echocardiogram 2D Echocardiogram has been performed.  [name] 04/03/2020, 2:58 PM

## 2020-04-03 NOTE — Progress Notes (Signed)
Attempted echo 2x's.  Patient at MRI  -[name], RDCS

## 2020-04-03 NOTE — Progress Notes (Signed)
[facility] Progress Note  Chief Complaint: Gallstone pancreatitis  History:  I received signout from Dr. Tarri Glenn on this patient, biliary pancreatitis and question of cholecystitis.  MRI MRCP was done yesterday in order to determine if choledocholithiasis requiring ERCP.  Patient had mental status change in neurologic symptoms with code stroke yesterday.  Neurology consult was reviewed, clinically consistent with temporal occipital CVA, and TPA was given.  Patient's neurologic symptoms are reportedly improved according to nursing, she has not yet had detailed neurologic exam by neurology consultant since yesterday.  She denies abdominal pain, nausea or vomiting.  She has been persistently hungry and was put on a regular diet yesterday, tolerated so far. No reported fever or rigors.  ROS: Cardiovascular: She denies chest pain Respiratory: Denies dyspnea Urinary: Denies dysuria  Objective:   Current Facility-Administered Medications:  .   stroke: mapping our early stages of recovery book, , Does not apply, Once, Donzetta Starch, NP .  acetaminophen (TYLENOL) tablet 650 mg, 650 mg, Oral, Q6H PRN, 650 mg at 04/03/20 0212 **OR** acetaminophen (TYLENOL) suppository 650 mg, 650 mg, Rectal, Q6H PRN, Opyd, Ilene Qua, MD .  Chlorhexidine Gluconate Cloth 2 % PADS 6 each, 6 each, Topical, Q0600, Kc, Ramesh, MD .  clevidipine (CLEVIPREX) infusion 0.5 mg/mL, 0-21 mg/hr, Intravenous, Continuous, Kerney Elbe, MD, Stopped at 04/02/20 1500 .  fentaNYL (SUBLIMAZE) injection 12.5-25 mcg, 12.5-25 mcg, Intravenous, Q2H PRN, Opyd, Timothy S, MD .  labetalol (NORMODYNE) injection 10 mg, 10 mg, Intravenous, Q2H PRN, Opyd, Timothy S, MD .  mupirocin ointment (BACTROBAN) 2 % 1 application, 1 application, Nasal, BID, Kc, Ramesh, MD .  ondansetron (ZOFRAN) injection 4 mg, 4 mg, Intravenous, Q6H PRN, Opyd, Ilene Qua, MD, 4 mg at 04/02/20 1423 .  piperacillin-tazobactam (ZOSYN) IVPB 3.375 g, 3.375 g, Intravenous, Q8H,  Opyd, Timothy S, MD, Last Rate: 12.5 mL/hr at 04/03/20 0800, Rate Verify at 04/03/20 0800 .  sodium chloride flush (NS) 0.9 % injection 3 mL, 3 mL, Intravenous, Q12H, Opyd, Ilene Qua, MD, 3 mL at 04/02/20 2331  . clevidipine Stopped (04/02/20 1500)  . piperacillin-tazobactam (ZOSYN)  IV 12.5 mL/hr at 04/03/20 0800     Vital signs in last 24 hrs: Vitals:   04/03/20 0700 04/03/20 0800  BP: (!) 125/46 (!) 147/63  Pulse: 68 79  Resp: 16 (!) 22  Temp:  98.8 F (37.1 C)  SpO2: 100% 97%    Intake/Output Summary (Last 24 hours) at 04/03/2020 0857 Last data filed at 04/03/2020 0800 Gross per 24 hour  Intake 367.23 ml  Output 550 ml  Net -182.77 ml     Physical Exam Febrile elderly woman lying in hospital bed.  She is alert and conversational, somewhat hard of hearing.  She reports being able to see me well on either side of the bed.  Normal gross motor function upper and lower extremities, good handgrip symmetrical bilaterally.  Able to properly name multiple items such as phone, ring and glove  HEENT: sclera anicteric, oral mucosa without lesions  Neck: supple, no thyromegaly, JVD or lymphadenopathy  Cardiac: RRR without murmurs, S1S2 heard, no peripheral edema  Pulm: clear to auscultation bilaterally, normal RR and effort noted  Abdomen: soft, no tenderness, with active bowel sounds. No guarding or palpable hepatosplenomegaly  Skin; warm and dry, no jaundice  Recent Labs:  CBC Latest Ref Rng & Units 04/03/2020 04/02/2020 04/01/2020  WBC 4.0 - 10.5 K/uL 5.5 10.5 11.8(H)  Hemoglobin 12.0 - 15.0 g/dL 9.4(L) 11.1(L) 11.6(L)  Hematocrit 36.0 -  46.0 % 29.4(L) 34.5(L) 36.4  Platelets 150 - 400 K/uL 195 225 242    No results for input(s): INR in the last 168 hours. CMP Latest Ref Rng & Units 04/02/2020 04/01/2020  Glucose 70 - 99 mg/dL 341(D) 622(W)  BUN 8 - 23 mg/dL 7(L) 12  Creatinine 9.79 - 1.00 mg/dL 8.92(J) 1.94(R)  Sodium 135 - 145 mmol/L 129(L) 123(L)  Potassium 3.5 - 5.1 mmol/L  4.2 4.3  Chloride 98 - 111 mmol/L 96(L) 92(L)  CO2 22 - 32 mmol/L 23 21(L)  Calcium 8.9 - 10.3 mg/dL 7.4(Y) 9.0  Total Protein 6.5 - 8.1 g/dL 6.0(L) 6.8  Total Bilirubin 0.3 - 1.2 mg/dL 1.1 1.1  Alkaline Phos 38 - 126 U/L 160(H) 188(H)  AST 15 - 41 U/L 736(H) 1,342(H)  ALT 0 - 44 U/L 565(H) 596(H)     Radiologic studies: CLINICAL DATA:  Pancreatitis suspected. Concern for choledocholithiasis.   EXAM: MRI ABDOMEN WITHOUT AND WITH CONTRAST (INCLUDING MRCP)   TECHNIQUE: Multiplanar multisequence MR imaging of the abdomen was performed both before and after the administration of intravenous contrast. Heavily T2-weighted images of the biliary and pancreatic ducts were obtained, and three-dimensional MRCP images were rendered by post processing.   CONTRAST:  5.55mL GADAVIST GADOBUTROL 1 MMOL/ML IV SOLN   COMPARISON:  CT 04/01/2020   FINDINGS: Lower chest:  Lung bases are clear.   Hepatobiliary: No intrahepatic biliary duct dilatation. The common hepatic duct measures 12 mm. The common bile duct measures 8 mm. There is no filling defect within the common bile duct. The duct dilatation extends to the ampulla.   The gallbladder is elongated and mildly distended to 4.0 cm. No gallbladder wall thickening. No gallstones noted. Small amount pericholecystic fluid (image 20/5).   Pancreas: Pancreatic duct is normal caliber. No variant pancreatic ductal anatomy. There is edema within the pancreatic parenchyma. This edema is more concentrated towards the head of the pancreas (image 18/11). There is small amount of fluid in Morrison's pouch and along the second portion duodenum (image 19/5). No organized fluid collections.   Spleen: Normal spleen.   Adrenals/urinary tract: Adrenal glands and kidneys are normal.   Stomach/Bowel: Stomach and limited of the small bowel is unremarkable   Vascular/Lymphatic: Abdominal aortic normal caliber. No retroperitoneal periportal  lymphadenopathy.   Musculoskeletal: No aggressive osseous lesion   IMPRESSION: 1. Findings most suggestive of mild acute pancreatitis. No organized fluid collections. 2. Dilatation of the common hepatic duct and common bile duct without obstructing lesion identified. No choledocholithiasis. Favor combination of chronic biliary duct dilatation and potential dilatation related to pancreatitis. 3. No gallstones present. No gallbladder wall thickening. Small amount of Peri cholecystic fluid is favored related to pancreatic inflammation.     Electronically Signed   By: Genevive Bi M.D.   On: 04/02/2020 08:20     Assessment & Plan  Assessment: Biliary pancreatitis.  This morning's labs are still pending, but as of yesterday WBC had normalized, lipase and LFTs were steadily improving.  I suspect she passed a biliary stone or sludge.  There is a question of cholecystitis on the initial imaging.  She has no tenderness on exam, I suspect of the small amount of pericholecystic fluid is from the pancreatitis.  Acute CVA, received TPA.  Patient is DNR status  Plan: No plans for ERCP since no obvious choledocholithiasis despite biliary ductal dilatation, LFTs are improving, patient is status post CVA and TPA.  Her exam is benign, tolerating regular diet well.  My  recommendation is to follow her LFTs at least until tomorrow.  If the Zosyn was originally started for question of cholecystitis, then I feel it can be discontinued.  Signing off - -call as needed  Total time 25 minutes including review of signout, chart review, patient evaluation and documentation. Charlie Pitter III Office: [phone]

## 2020-04-04 LAB — COMPREHENSIVE METABOLIC PANEL
ALT: 169 U/L — ABNORMAL HIGH (ref 0–44)
AST: 75 U/L — ABNORMAL HIGH (ref 15–41)
Albumin: 2.9 g/dL — ABNORMAL LOW (ref 3.5–5.0)
Alkaline Phosphatase: 104 U/L (ref 38–126)
Anion gap: 13 (ref 5–15)
BUN: 8 mg/dL (ref 8–23)
CO2: 21 mmol/L — ABNORMAL LOW (ref 22–32)
Calcium: 8.6 mg/dL — ABNORMAL LOW (ref 8.9–10.3)
Chloride: 97 mmol/L — ABNORMAL LOW (ref 98–111)
Creatinine, Ser: 0.49 mg/dL (ref 0.44–1.00)
GFR calc Af Amer: >60 mL/min (ref >60)
GFR calc non Af Amer: >60 mL/min (ref >60)
Glucose, Bld: 75 mg/dL (ref 70–99)
Potassium: 3.5 mmol/L (ref 3.5–5.1)
Sodium: 131 mmol/L — ABNORMAL LOW (ref 135–145)
Total Bilirubin: 0.6 mg/dL (ref 0.3–1.2)
Total Protein: 5.6 g/dL — ABNORMAL LOW (ref 6.5–8.1)

## 2020-04-04 LAB — CBC
HCT: 32.1 % — ABNORMAL LOW (ref 36.0–46.0)
Hemoglobin: 10.1 g/dL — ABNORMAL LOW (ref 12.0–15.0)
MCH: 21.7 pg — ABNORMAL LOW (ref 26.0–34.0)
MCHC: 31.5 g/dL (ref 30.0–36.0)
MCV: 69 fL — ABNORMAL LOW (ref 80.0–100.0)
Platelets: 218 10*3/uL (ref 150–400)
RBC: 4.65 MIL/uL (ref 3.87–5.11)
RDW: 14.7 % (ref 11.5–15.5)
WBC: 5.7 10*3/uL (ref 4.0–10.5)
nRBC: 0 % (ref 0.0–0.2)

## 2020-04-04 MED ORDER — POTASSIUM CHLORIDE CRYS ER 20 MEQ PO TBCR
20.0000 meq | EXTENDED_RELEASE_TABLET | Freq: Once | ORAL | Status: AC
  Administered 2020-04-04: 20 meq via ORAL
  Filled 2020-04-04: qty 1

## 2020-04-04 MED ORDER — ASPIRIN EC 81 MG PO TBEC
81.0000 mg | DELAYED_RELEASE_TABLET | Freq: Every day | ORAL | Status: DC
  Administered 2020-04-04 – 2020-04-05 (×2): 81 mg via ORAL
  Filled 2020-04-04 (×2): qty 1

## 2020-04-04 MED ORDER — ENOXAPARIN SODIUM 40 MG/0.4ML ~~LOC~~ SOLN
40.0000 mg | SUBCUTANEOUS | Status: DC
  Administered 2020-04-04 – 2020-04-05 (×2): 40 mg via SUBCUTANEOUS
  Filled 2020-04-04 (×2): qty 0.4

## 2020-04-04 MED ORDER — LORATADINE 10 MG PO TABS
10.0000 mg | ORAL_TABLET | Freq: Every day | ORAL | Status: DC
  Administered 2020-04-04 – 2020-04-05 (×2): 10 mg via ORAL
  Filled 2020-04-04 (×2): qty 1

## 2020-04-04 NOTE — Evaluation (Signed)
Physical Therapy Evaluation Patient Details Name: Meghan Kim MRN: [id number] DOB: 06-08-1926 Today's Date: 04/04/2020   History of Present Illness  Meghan Kim is a 86 y.o. female with medical history significant for hypertension, hypothyroidism, arthritis, and neuropathy, now presenting to the emergency department with acute onset of pain in her lower chest and upper abdomen along with nausea. Pt found to have acute cholecystitis and inflamed had of pancreas. Code stroke was then called as pt with slurred speech and word finding difficulty. IV tpa given on 5/9. Imaging negative for hemorrhage or acute infarct.    Clinical Impression  Pt admitted with above. Pt functioning at minA at this time. Pt uses upright walker at home but not available in hospital so used 2 wheeled RW. Pt more guarded, cautious and timid during ambulation requiring minA for stability and navigation of walker in room. Pt at increased risk of falling and does agree she is weaker than PTA. Discussed with pt to arrange for 24/7 assist initially upon d/c home whether it is her son or hiring visiting angels for longer time. Acute PT to cont to follow.    Follow Up Recommendations Home health PT;Supervision/Assistance - 24 hour(24/7 assist initially)    Equipment Recommendations  None recommended by PT(has equip at home)    Recommendations for Other Services       Precautions / Restrictions Precautions Precautions: Fall Precaution Comments: HOH, bilat hearing aides Restrictions Weight Bearing Restrictions: No      Mobility  Bed Mobility               General bed mobility comments: pt up in chair upon PT arrival  Transfers Overall transfer level: Needs assistance Equipment used: Rolling walker (2 wheeled) Transfers: Sit to/from Stand Sit to Stand: Min assist         General transfer comment: increased time, good use of hands on chair to push up from, min A to steady during transition of  hands from chair to RW  Ambulation/Gait Ambulation/Gait assistance: Min assist Gait Distance (Feet): 40 Feet Assistive device: Rolling walker (2 wheeled) Gait Pattern/deviations: Shuffle;Trunk flexed;Decreased stride length Gait velocity: slow Gait velocity interpretation: <1.31 ft/sec, indicative of household ambulator General Gait Details: pt very guarded and cautious as pt typically uses an upright walker, minA for walker management to navigate around room, pt with no foot clearance bilaterally, frequent stops  Stairs            Wheelchair Mobility    Modified Rankin (Stroke Patients Only) Modified Rankin (Stroke Patients Only) Pre-Morbid Rankin Score: Moderate disability Modified Rankin: Moderate disability     Balance Overall balance assessment: Needs assistance Sitting-balance support: Feet supported;No upper extremity supported Sitting balance-Leahy Scale: Fair     Standing balance support: Bilateral upper extremity supported;During functional activity Standing balance-Leahy Scale: Poor Standing balance comment: dependent on RW                             Pertinent Vitals/Pain Pain Assessment: No/denies pain    Home Living Family/patient expects to be discharged to:: Private residence Living Arrangements: Alone Available Help at Discharge: [place] from 8-12 and 3-6 7 days a week.) Type of Home: House Home Access: Level entry     Home Layout: One level Home Equipment: Walker - 4 wheels;Bedside commode;Shower seat - built in;Grab bars - tub/shower;Grab bars - toilet;Wheelchair - manual(upright walker)      Prior Function Level of Independence:  Needs assistance   Gait / Transfers Assistance Needed: HHPT has been seeing pt 2x/wk since novement, pt uses upright walker but recently has been using w/c due to weakness and abdominal pain, pt uses BSC at night but can walk into bathroom during the day  ADL's /  Homemaking Assistance Needed: hired care comes in to supervise ADLs, pt can bathe self but aide supervises, aide cooks her meals and does the grocery shopping, pt doesn't drive. pt cares for her small dog who is 13yo        Hand Dominance   Dominant Hand: Right    Extremity/Trunk Assessment   Upper Extremity Assessment Upper Extremity Assessment: Generalized weakness(R grip weaker than left but reports neuropathy)    Lower Extremity Assessment Lower Extremity Assessment: Generalized weakness    Cervical / Trunk Assessment Cervical / Trunk Assessment: Kyphotic  Communication   Communication: HOH  Cognition Arousal/Alertness: Awake/alert Behavior During Therapy: WFL for tasks assessed/performed Overall Cognitive Status: No family/caregiver present to determine baseline cognitive functioning                                 General Comments: suspect pt is near her baseline, pt very independent and is aware she is weaker now but isn't sure she can get 24/7 assist      General Comments General comments (skin integrity, edema, etc.): VSS, pt wears bilat hearing Aides, pt assisted to bathroom, left on commode to attempt to have BM, RN present    Exercises     Assessment/Plan    PT Assessment Patient needs continued PT services  PT Problem List Decreased strength;Decreased activity tolerance;Decreased balance;Decreased mobility;Decreased coordination;Decreased cognition       PT Treatment Interventions DME instruction;Gait training;Stair training;Functional mobility training;Therapeutic exercise;Therapeutic activities;Balance training;Neuromuscular re-education    PT Goals (Current goals can be found in the Care Plan section)  Acute Rehab PT Goals Patient Stated Goal: home today PT Goal Formulation: With patient Time For Goal Achievement: 04/18/20 Potential to Achieve Goals: Good    Frequency Min 3X/week   Barriers to discharge Decreased caregiver  support has assist from 8-12 and 3-6pm    Co-evaluation               AM-PAC PT "6 Clicks" Mobility  Outcome Measure Help needed turning from your back to your side while in a flat bed without using bedrails?: A Little Help needed moving from lying on your back to sitting on the side of a flat bed without using bedrails?: A Little Help needed moving to and from a bed to a chair (including a wheelchair)?: A Little Help needed standing up from a chair using your arms (e.g., wheelchair or bedside chair)?: A Little Help needed to walk in hospital room?: A Little Help needed climbing 3-5 steps with a railing? : A Lot 6 Click Score: 17    End of Session Equipment Utilized During Treatment: Gait belt Activity Tolerance: Patient tolerated treatment well Patient left: (on commode with RN present) Nurse Communication: Mobility status PT Visit Diagnosis: Unsteadiness on feet (R26.81);Difficulty in walking, not elsewhere classified (R26.2)    Time: 0981-1914 PT Time Calculation (min) (ACUTE ONLY): 29 min   Charges:   PT Evaluation $PT Eval Moderate Complexity: 1 Mod PT Treatments $Gait Training: 8-22 mins        Lewis Shock, PT, DPT Acute Rehabilitation Services Pager #: [phone] Office #: [phone]   Iona Hansen  04/04/2020, 11:17 AM

## 2020-04-04 NOTE — Progress Notes (Signed)
STROKE TEAM PROGRESS NOTE   INTERVAL HISTORY Patient continues to do well.  She is up and walking on the unit with physical therapist.  She states she is not ready to go home.  Vital signs are stable.  No neurological changes.  Vitals:   04/04/20 0800 04/04/20 1000 04/04/20 1100 04/04/20 1200  BP:   (!) 92/58 (!) 135/113  Pulse:  98  71  Resp:  19  (!) 26  Temp: 98.1 F (36.7 C)   97.6 F (36.4 C)  TempSrc:    Oral  SpO2:  98%  100%  Weight:      Height:        CBC:  Recent Labs  Lab 04/02/20 0319 04/02/20 0319 04/03/20 0642 04/04/20 0639  WBC 10.5   < > 5.5 5.7  NEUTROABS 8.9*  --  4.3  --   HGB 11.1*   < > 9.4* 10.1*  HCT 34.5*   < > 29.4* 32.1*  MCV 68.3*   < > 68.2* 69.0*  PLT 225   < > 195 218   < > = values in this interval not displayed.    Basic Metabolic Panel:  Recent Labs  Lab 04/03/20 0642 04/04/20 0639  NA 131* 131*  K 3.5 3.5  CL 101 97*  CO2 22 21*  GLUCOSE 84 75  BUN 8 8  CREATININE 0.53 0.49  CALCIUM 8.3* 8.6*   Lipid Panel:     Component Value Date/Time   CHOL 149 04/03/2020 0813   TRIG 45 04/03/2020 0813   HDL 68 04/03/2020 0813   CHOLHDL 2.2 04/03/2020 0813   VLDL 9 04/03/2020 0813   LDLCALC 72 04/03/2020 0813   HgbA1c:  Lab Results  Component Value Date   HGBA1C 5.4 04/03/2020   IMAGING past 24 hours ECHOCARDIOGRAM COMPLETE  Result Date: 04/03/2020    ECHOCARDIOGRAM REPORT   Patient Name:   KEYAUNA GRAEFE Date of Exam: 04/03/2020 Medical Rec #:  [id number]          Height:       62.0 in Accession #:    [id number]         Weight:       128.3 lb Date of Birth:  [birth year]          BSA:          1.583 m Patient Age:    89 years           BP:           134/99 mmHg Patient Gender: F                  HR:           87 bpm. Exam Location:  Inpatient Procedure: 2D Echo Indications:    stroke 434.91  History:        Patient has no prior history of Echocardiogram examinations.                 Risk Factors:Hypertension and Former Smoker.   Sonographer:    [name] RDCS (AE) Referring Phys: [id number] SHARON L BIBY IMPRESSIONS  1. Left ventricular ejection fraction, by estimation, is 65 to 70%. The left ventricle has normal function. The left ventricle has no regional wall motion abnormalities. There is moderate left ventricular hypertrophy. Left ventricular diastolic parameters are indeterminate.  2. Right ventricular systolic function is normal. The right ventricular size is normal. Tricuspid regurgitation signal is inadequate for assessing PA pressure.  3. The mitral valve is normal in structure. No evidence of mitral valve regurgitation.  4. The aortic valve is tricuspid. Aortic valve regurgitation is not visualized. Mild aortic valve sclerosis is present, with no evidence of aortic valve stenosis. FINDINGS  Left Ventricle: Left ventricular ejection fraction, by estimation, is 65 to 70%. The left ventricle has normal function. The left ventricle has no regional wall motion abnormalities. The left ventricular internal cavity size was small. There is moderate  left ventricular hypertrophy. Left ventricular diastolic parameters are indeterminate. Right Ventricle: The right ventricular size is normal. Right vetricular wall thickness was not assessed. Right ventricular systolic function is normal. Tricuspid regurgitation signal is inadequate for assessing PA pressure. Left Atrium: Left atrial size was normal in size. Right Atrium: Right atrial size was normal in size. Pericardium: Trivial pericardial effusion is present. Presence of pericardial fat pad. Mitral Valve: The mitral valve is normal in structure. No evidence of mitral valve regurgitation. Tricuspid Valve: The tricuspid valve is normal in structure. Tricuspid valve regurgitation is not demonstrated. Aortic Valve: The aortic valve is tricuspid. Aortic valve regurgitation is not visualized. Mild aortic valve sclerosis is present, with no evidence of aortic valve stenosis. Pulmonic Valve: The  pulmonic valve was not well visualized. Pulmonic valve regurgitation is not visualized. Aorta: The aortic root is normal in size and structure. IAS/Shunts: The interatrial septum was not well visualized.  LEFT VENTRICLE PLAX 2D LVIDd:         3.20 cm LVIDs:         2.10 cm LV PW:         1.10 cm LV IVS:        1.10 cm LVOT diam:     1.90 cm LV SV:         68 LV SV Index:   43 LVOT Area:     2.84 cm  LEFT ATRIUM             Index       RIGHT ATRIUM           Index LA diam:        3.30 cm 2.08 cm/m  RA Area:     12.50 cm LA Vol (A2C):   26.4 ml 16.68 ml/m RA Volume:   26.70 ml  16.87 ml/m LA Vol (A4C):   24.9 ml 15.73 ml/m LA Biplane Vol: 28.0 ml 17.69 ml/m  AORTIC VALVE LVOT Vmax:   108.00 cm/s LVOT Vmean:  77.000 cm/s LVOT VTI:    0.240 m  AORTA Ao Root diam: 2.80 cm  SHUNTS Systemic VTI:  0.24 m Systemic Diam: 1.90 cm Oswaldo Milian MD Electronically signed by Oswaldo Milian MD Signature Date/Time: 04/03/2020/9:54:13 PM    Final     PHYSICAL EXAM Pleasant elderly Caucasian lady not in distress.  She is hard of hearing. . Afebrile. Head is nontraumatic. Neck is supple without bruit.    Cardiac exam no murmur or gallop. Lungs are clear to auscultation. Distal pulses are well felt. Neurological Exam ;  Awake  Alert oriented x 3. Normal speech and language.eye movements full without nystagmus.fundi were not visualized. Vision acuity and fields appear normal. Hearing is normal. Palatal movements are normal. Face symmetric. Tongue midline. Normal strength, tone, reflexes and coordination. Normal sensation. Gait deferred.  ASSESSMENT/PLAN Ms. Ioanna Colquhoun is a 86 y.o. female with history of HTN, neuropathy, thyroid dz  presenting to [facility] with chest and abdominal pain after eating lunch. Elevated LFTs, likely  cholecystitis or choledocholithisis. The following am developed expressive aphasia and workd finding fiddiculties. Received IV tPA 04/02/20 at 1048. Admitted to neuro  ICU.    Stroke-like sx s/p IV tPA  Code Stroke CT head No acute abnormality. Small vessel disease. Atrophy. Chronic L thalamic and cerebellar lacunes. Sinus dz. ASPECTS 10.     CTA head & neck negative for large vessel occlusion.  Bilateral calcified carotid siphon plaque with moderate right and mild left-sided carotid stenosis.  MRI no acute infarct.  Changes of age-related small vessel disease.  2D Echo EF 65-70%. No source of embolus    LDL 72 mg   HgbA1c 5.4  SCDs for VTE prophylaxis. Ok for pharmaceutical prophylaxis  No antithrombotic prior to admission, now on No antithrombotic. Add aspirin 81 as imaging neg for hemorrhage. Continue at d/c   Therapy recommendations:  HH PT   Disposition: Home  Transfer to the floor pending   Hypertension  Home meds:  None listed  Stable off meds . BP goal normotensive  Hyperlipidemia  Home meds:  Fish oil  LDL 72, goal < 70  Hold statin considering elevated LFTs  Other Stroke Risk Factors  Advanced age  Former Cigarette smoker  ETOH use, advised to drink no more than 1 drink(s) a day  Other Active Problems  Mild gallstone pancreatitis. Elevated LFTs AST/ALT/ALP 736/565/160->165/238/113_. 75/169/104 - monitor. WBC normalized. Zosyn 5/9>>5/10. Tolerating regular diet. GI consulted (Dan[facility])    Hypothyroidism on synthroid PTA   Chronic hyponatremia 131  Anemia 11.1   Hospital day # 3  Continue mobilization out of bed and ongoing therapies.  Likely discharge home soon.  Continue aspirin 81 mg for stroke prevention and aggressive risk factor modification.  Follow-up as an outpatient stroke clinic in 6 weeks.  Discussed with patient and Dr. Dayna Barker  Greater than 50% time during this 25-minute visit was spent in counseling and coordination of care and answering questions.  Stroke team will sign off.  Kindly call for questions   Delia Heady, MD  To contact Stroke Continuity provider, please refer to [email or web address]. After  hours, contact General Neurology

## 2020-04-04 NOTE — Progress Notes (Signed)
PROGRESS NOTE    Meghan Kim  JKD:[id number] DOB: [birth year] DOA: 04/01/2020 PCP: [facility], [facility]   Brief Narrative:   86 y.o. female with medical history significant for hypertension, hypothyroidism, arthritis, and neuropathy [facility]8 with complaint of pain in the lower chest and upper abdomen after having her lunch.  She was seen in the ED was hypertensive 200/90, tachycardic, with first-degree AV nodal block, chest x-ray no acute finding, troponin negative x2 blood work showed hyponatremia sodium 123 abnormal LFTs AST 1342, ALT 596, and lipase 1575. leukocytosis to 18,800.  Covid and influenza PCR were negative. RUQ [place]- CBD diameter of 6.5 and otherwise unremarkable though limited by overlying bowel gas.  CT the abdomen pelvis reveals significantly distended gallbladder with pericholecystic inflammatory changes and significant intra and extrahepatic biliary ductal dilatation concerning for acute cholecystitis or choledocholithiasis.  Also noted on CT is inflammatory changes about the head of the pancreas.  Patient was given a liter of lactated Ringer's, Zofran, Rocephin, Flagyl, and multiple doses of IV fentanyl in the ED. GI was contacted and patient was admitted to [facility]. 04/03/19 9 AM patient had episode where she had word finding difficulties underwent code stroke protocol seen by neurology CT head no acute finding.Patient underwent TPA 04/02/2020 and moved to 4 N. MRCP did not cholecystitis or choledocholithiasis but showed pancreatitis.  Subjective:  Overnight afebrile T-max 99.2. BP Stable in 100-140s Pt moved to 4N after code stroke protocal on 04/02/20. Tolerating diet has not needed Zofran or pain medicine. But complains of some abdominal discomfort this am, dose not feel ready for home today and hopeful for going home tomorrow.  Assessment & Plan:  Abdominal discomfort with elevated LFTs/elevated lipase: suspected acute  cholecystitis/biliary pancreatitis: Patient underwent MRCP that showed mild acute pancreatitis, no fluid collection, common hepatic and common bile duct dilation without obstruction, no choledocholithiasis, suspecting chronic biliary duct dilatation and potential dilation related to pancreatitis, no gallstones and no gallbladder wall thickening, has a small amount of pericholecystic fluid which is favored related to pancreatic inflammation.  Unclear etiology, no alcohol use, no gallstone, TG normal.  Seen by GI- cont on regular diet, off zosyn.Lfts  And lipase significantly down. GI signed off. Recent Labs  Lab 04/01/20 1923 04/02/20 0319 04/03/20 0642 04/04/20 0639  AST 1,342* 736* 165* 75*  ALT 596* 565* 238* 169*  ALKPHOS 188* 160* 113 104  BILITOT 1.1 1.1 0.7 0.6  PROT 6.8 6.0* 5.4* 5.6*  ALBUMIN 3.8 3.2* 2.7* 2.9*   Code stroke due to  expressive aphasia/word finding difficulty noted at 9:15 AM last known normal 7.30 am on 04/02/20:Status post code stroke and TPA by neurology.CT head showed no evidence of acute intracranial abnormality. Cont plan as per neuro-repeat repeat CT  Head-no bleeding, CTA head and neck no large vessel occlusion, MRI brain no acute finding, changes of age-related small vessel disease.  LDL at 72, hemoglobin A1c 5.4, 2D echo-EF 65 to 70% moderate LVH RV function normal.  Discussed w/ Neurology advised to continue aspirin 81 mg as she was not on antiplatelets prior to admission.PT OT eval today, anticipating HHPT.  Hypothyroidism:Cont home synthroid  HTN:BP fluctuating hypotensive 90/58  Once this morning otherwise 110s to 140s. Holding home Lasix,valsartan and lisinopril.  Hyponatremia acute on chronic:123 on admission slightly lower than recent prior labs, overall euvolemic, off ivf. Sodium is improved to 131. encourage po. Recent Labs  Lab 04/01/20 1923 04/02/20 0319 04/03/20 0998 04/04/20 3382  NA 123* 129* 131* 131*   DVT prophylaxis:SCD. Code  Status:DNR Family Communication: plan of care discussed with patient. No family at bedside.  Status is: Inpatient Remains inpatient appropriate because: Due to intensity of illness with acute history of, acute pancreatitis, ongoing PT OT EVAL. Anticipate d/c in am if remains stable, tolerating diet and no abd discomfort..  Dispo: The patient is from: Home              Anticipated d/c is to: Home vs SNF- PT eval for Dispo.              Anticipated d/c date is: 1 days              Patient currently is not medically stable to d/c.  Nutrition: Diet Order            Diet regular Room service appropriate? Yes with Assist; Fluid consistency: Thin  Diet effective now              Body mass index is 23.47 kg/m. Consultants:see note  Procedures:  MRCP 04/02/20 1. Findings most suggestive of mild acute pancreatitis. No organized fluid collections. 2. Dilatation of the common hepatic duct and common bile duct without obstructing lesion identified. No choledocholithiasis. Favor combination of chronic biliary duct dilatation and potential dilatation related to pancreatitis. 3. No gallstones present. No gallbladder wall thickening. Small amount of Peri cholecystic fluid is favored related to pancreatic inflammation.  Microbiology:see note  Medications: Scheduled Meds: . aspirin EC  81 mg Oral Daily  . Chlorhexidine Gluconate Cloth  6 each Topical Q0600  . docusate sodium  100 mg Oral Q breakfast  . enoxaparin (LOVENOX) injection  40 mg Subcutaneous Q24H  . fluticasone  2 spray Each Nare BID  . gabapentin  300 mg Oral QHS  . levothyroxine  50 mcg Oral QAC breakfast  . loratadine  10 mg Oral Daily  . mirabegron ER  25 mg Oral Q breakfast  . multivitamin with minerals  1 tablet Oral Daily  . mupirocin ointment  1 application Nasal BID  . omega-3 acid ethyl esters  1 g Oral Daily  . sodium chloride flush  3 mL Intravenous Q12H  . temazepam  15 mg Oral QHS   Continuous  Infusions:   Antimicrobials: Anti-infectives (From admission, onward)   Start     Dose/Rate Route Frequency Ordered Stop   04/02/20 0400  piperacillin-tazobactam (ZOSYN) IVPB 3.375 g  Status:  Discontinued     3.375 g 12.5 mL/hr over 240 Minutes Intravenous Every 8 hours 04/02/20 0243 04/03/20 1045   04/01/20 2345  metroNIDAZOLE (FLAGYL) IVPB 500 mg     500 mg 100 mL/hr over 60 Minutes Intravenous  Once 04/01/20 2340 04/02/20 0134   04/01/20 2300  cefTRIAXone (ROCEPHIN) 1 g in sodium chloride 0.9 % 100 mL IVPB     1 g 200 mL/hr over 30 Minutes Intravenous  Once 04/01/20 2257 04/01/20 2358       Objective: Vitals: Today's Vitals   04/04/20 0800 04/04/20 1000 04/04/20 1100 04/04/20 1200  BP:   (!) 92/58 (!) 135/113  Pulse:  98  71  Resp:  19  (!) 26  Temp: 98.1 F (36.7 C)   97.6 F (36.4 C)  TempSrc:    Oral  SpO2:  98%  100%  Weight:      Height:      PainSc: Asleep 0-No pain  0-No pain    Intake/Output Summary (Last 24 hours) at  04/04/2020 1431 Last data filed at 04/03/2020 1800 Gross per 24 hour  Intake --  Output 350 ml  Net -350 ml   Filed Weights   04/01/20 1855 04/02/20 0227 04/02/20 1115  Weight: 54.4 kg 59.4 kg 58.2 kg   Weight change:    Intake/Output from previous day: 05/10 0701 - 05/11 0700 In: 277.1 [P.O.:240; IV Piggyback:37.1] Out: 350 [Urine:350] Intake/Output this shift: No intake/output data recorded.  Examination: General exam: AAO at baseline, hard of hearing,NAD,Weak appearing. HEENT:Oral mucosa moist, Ear/Nose WNL grossly, dentition normal. Respiratory system: bilaterally clear,no wheezing or crackles,no use of accessory muscle Cardiovascular system: S1 & S2 +, No JVD,. Gastrointestinal system: Abdomen soft, NT,ND,BS+ Nervous System:Alert, awake, moving extremities and grossly nonfocal. Extremities: No edema, distal peripheral pulses palpable.  Skin: No rashes,no icterus. MSK: Normal muscle bulk,tone, power.  Data Reviewed: I have  personally reviewed following labs and imaging studies CBC: Recent Labs  Lab 04/01/20 1923 04/02/20 0319 04/03/20 0642 04/04/20 0639  WBC 11.8* 10.5 5.5 5.7  NEUTROABS 10.6* 8.9* 4.3  --   HGB 11.6* 11.1* 9.4* 10.1*  HCT 36.4 34.5* 29.4* 32.1*  MCV 68.0* 68.3* 68.2* 69.0*  PLT 242 225 195 218   Basic Metabolic Panel: Recent Labs  Lab 04/01/20 1923 04/02/20 0319 04/03/20 0642 04/04/20 0639  NA 123* 129* 131* 131*  K 4.3 4.2 3.5 3.5  CL 92* 96* 101 97*  CO2 21* 23 22 21*  GLUCOSE 141* 110* 84 75  BUN 12 7* 8 8  CREATININE 0.39* 0.42* 0.53 0.49  CALCIUM 9.0 8.8* 8.3* 8.6*   GFR: Estimated Creatinine Clearance: 36.2 mL/min (by C-G formula based on SCr of 0.49 mg/dL). Liver Function Tests: Recent Labs  Lab 04/01/20 1923 04/02/20 0319 04/03/20 0642 04/04/20 0639  AST 1,342* 736* 165* 75*  ALT 596* 565* 238* 169*  ALKPHOS 188* 160* 113 104  BILITOT 1.1 1.1 0.7 0.6  PROT 6.8 6.0* 5.4* 5.6*  ALBUMIN 3.8 3.2* 2.7* 2.9*   Recent Labs  Lab 04/01/20 1923 04/02/20 0319 04/03/20 0642  LIPASE 1,575* 896* 81*   No results for input(s): AMMONIA in the last 168 hours. Coagulation Profile: No results for input(s): INR, PROTIME in the last 168 hours. Cardiac Enzymes: No results for input(s): CKTOTAL, CKMB, CKMBINDEX, TROPONINI in the last 168 hours. BNP (last 3 results) No results for input(s): PROBNP in the last 8760 hours. HbA1C: Recent Labs    04/03/20 0813  HGBA1C 5.4   CBG: Recent Labs  Lab 04/02/20 0959  GLUCAP 83   Lipid Profile: Recent Labs    04/03/20 0642 04/03/20 0813  CHOL  --  149  HDL  --  68  LDLCALC  --  72  TRIG 42 45  CHOLHDL  --  2.2   Thyroid Function Tests: No results for input(s): TSH, T4TOTAL, FREET4, T3FREE, THYROIDAB in the last 72 hours. Anemia Panel: No results for input(s): VITAMINB12, FOLATE, FERRITIN, TIBC, IRON, RETICCTPCT in the last 72 hours. Sepsis Labs: No results for input(s): PROCALCITON, LATICACIDVEN in the last  168 hours.  Recent Results (from the past 240 hour(s))  Respiratory Panel by RT PCR (Flu A&B, Covid) - Nasopharyngeal Swab     Status: None   Collection Time: 04/01/20 10:08 PM   Specimen: Nasopharyngeal Swab  Result Value Ref Range Status   SARS Coronavirus 2 by RT PCR NEGATIVE NEGATIVE Final    Comment: (NOTE) SARS-CoV-2 target nucleic acids are NOT DETECTED. The SARS-CoV-2 RNA is generally detectable in upper respiratoy  specimens during the acute phase of infection. The lowest concentration of SARS-CoV-2 viral copies this assay can detect is 131 copies/mL. A negative result does not preclude SARS-Cov-2 infection and should not be used as the sole basis for treatment or other patient management decisions. A negative result may occur with  improper specimen collection/handling, submission of specimen other than nasopharyngeal swab, presence of viral mutation(s) within the areas targeted by this assay, and inadequate number of viral copies (<131 copies/mL). A negative result must be combined with clinical observations, patient history, and epidemiological information. The expected result is Negative. Fact Sheet for Patients:  https://www.moore.com/ Fact Sheet for Healthcare Providers:  https://www.young.biz/ This test is not yet ap proved or cleared by the [place] FDA and  has been authorized for detection and/or diagnosis of SARS-CoV-2 by FDA under an Emergency Use Authorization (EUA). This EUA will remain  in effect (meaning this test can be used) for the duration of the COVID-19 declaration under Section 564(b)(1) of the Act, 21 U.S.C. section 360bbb-3(b)(1), unless the authorization is terminated or revoked sooner.    Influenza A by PCR NEGATIVE NEGATIVE Final   Influenza B by PCR NEGATIVE NEGATIVE Final    Comment: (NOTE) The Xpert Xpress SARS-CoV-2/FLU/RSV assay is intended as an aid in  the diagnosis of influenza from  Nasopharyngeal swab specimens and  should not be used as a sole basis for treatment. Nasal washings and  aspirates are unacceptable for Xpert Xpress SARS-CoV-2/FLU/RSV  testing. Fact Sheet for Patients: https://www.moore.com/ Fact Sheet for Healthcare Providers: https://www.young.biz/ This test is not yet approved or cleared by the [place] FDA and  has been authorized for detection and/or diagnosis of SARS-CoV-2 by  FDA under an Emergency Use Authorization (EUA). This EUA will remain  in effect (meaning this test can be used) for the duration of the  Covid-19 declaration under Section 564(b)(1) of the Act, 21  U.S.C. section 360bbb-3(b)(1), unless the authorization is  terminated or revoked. Performed at [facility], [address] [place], [place] [address]   MRSA PCR Screening     Status: Abnormal   Collection Time: 04/02/20 12:13 PM   Specimen: Nasopharyngeal  Result Value Ref Range Status   MRSA by PCR POSITIVE (A) NEGATIVE Final    Comment:        The GeneXpert MRSA Assay (FDA approved for NASAL specimens only), is one component of a comprehensive MRSA colonization surveillance program. It is not intended to diagnose MRSA infection nor to guide or monitor treatment for MRSA infections. RESULT CALLED TO, READ BACK BY AND VERIFIED WITH: RN Antonieta Loveless [id number] 04/02/20 KB       Radiology Studies: CT ANGIO HEAD W OR WO CONTRAST  Result Date: 04/03/2020 CLINICAL DATA:  86 year old female code stroke presentation on 04/02/2020. Status post IV tPA. MRI earlier today without convincing acute infarct. EXAM: CT ANGIOGRAPHY HEAD AND NECK TECHNIQUE: Multidetector CT imaging of the head and neck was performed using the standard protocol during bolus administration of intravenous contrast. Multiplanar CT image reconstructions and MIPs were obtained to evaluate the vascular anatomy. Carotid stenosis measurements (when  applicable) are obtained utilizing NASCET criteria, using the distal internal carotid diameter as the denominator. CONTRAST:  50 mL Omnipaque 350 COMPARISON:  Brain MRI earlier today. Head CT yesterday. FINDINGS: CTA NECK Skeleton: Degenerative changes throughout the cervical spine with multilevel ankylosis. No acute osseous abnormality identified. Upper chest: Negative. Other neck: No acute findings. Aortic arch: Calcified aortic atherosclerosis. 3 vessel  arch configuration. Right carotid system: No brachiocephalic artery or right CCA origin stenosis despite plaque. Mildly tortuous right CCA. Negative right carotid bifurcation. Tortuous cervical right ICA distal to the bulb with a kinked appearance (series 11, image 19), but no stenosis to the skull base. Left carotid system: Left CCA origin calcified plaque without stenosis. Mildly tortuous left CCA. Negative left carotid bifurcation. Tortuous left ICA distal to the bulb, no stenosis to the skull base. Vertebral arteries: Mild plaque in the proximal right subclavian artery without stenosis. Normal right vertebral artery origin. Tortuous right V1 segment. Tortuous right V 2 segment. No right vertebral artery stenosis to the skull base. Proximal left subclavian artery calcified plaque without significant stenosis. Normal left vertebral artery origin. Tortuous left V1 through V3 segments with a mildly dominant left vertebral artery, no stenosis to the skull base. CTA HEAD Posterior circulation: Mildly dominant left V4 segment. Normal PICA origins and no distal vertebral plaque or stenosis. Patent vertebrobasilar junction and basilar artery without stenosis. Normal SCA and right PCA origins. Fetal type left PCA origin. Diminutive right posterior communicating artery. Bilateral PCA branches are within normal limits. Anterior circulation: Both ICA siphons are patent. On the left there is moderate calcified plaque at the anterior genu but only mild stenosis. On the right  there is more intermittent calcified plaque, and mild to moderate anterior genu stenosis. Normal ophthalmic and posterior communicating artery origins. Patent carotid termini. Normal MCA and ACA origins. Anterior communicating artery and bilateral ACA branches are within normal limits. Left MCA M1 segment and bifurcation are patent without stenosis. Right MCA M1 segment and bifurcation are patent without stenosis. Bilateral MCA branches are within normal limits. Venous sinuses: Patent. Anatomic variants: Mildly dominant left vertebral artery. Fetal left PCA origin. Review of the MIP images confirms the above findings IMPRESSION: 1. Negative for large vessel occlusion. 2. Positive for bilateral ICA siphon calcified plaque with up to Moderate Right and Mild Left intracranial ICA stenoses at the anterior genu. 3. Tortuous vessels but minimal atherosclerosis for age elsewhere, and no other arterial stenosis in the head or neck. 4. Aortic Atherosclerosis (ICD10-I70.0). Electronically Signed   By: Odessa FlemingH  Hall M.D.   On: 04/03/2020 13:31   CT ANGIO NECK W OR WO CONTRAST  Result Date: 04/03/2020 CLINICAL DATA:  86 year old female code stroke presentation on 04/02/2020. Status post IV tPA. MRI earlier today without convincing acute infarct. EXAM: CT ANGIOGRAPHY HEAD AND NECK TECHNIQUE: Multidetector CT imaging of the head and neck was performed using the standard protocol during bolus administration of intravenous contrast. Multiplanar CT image reconstructions and MIPs were obtained to evaluate the vascular anatomy. Carotid stenosis measurements (when applicable) are obtained utilizing NASCET criteria, using the distal internal carotid diameter as the denominator. CONTRAST:  50 mL Omnipaque 350 COMPARISON:  Brain MRI earlier today. Head CT yesterday. FINDINGS: CTA NECK Skeleton: Degenerative changes throughout the cervical spine with multilevel ankylosis. No acute osseous abnormality identified. Upper chest: Negative. Other  neck: No acute findings. Aortic arch: Calcified aortic atherosclerosis. 3 vessel arch configuration. Right carotid system: No brachiocephalic artery or right CCA origin stenosis despite plaque. Mildly tortuous right CCA. Negative right carotid bifurcation. Tortuous cervical right ICA distal to the bulb with a kinked appearance (series 11, image 19), but no stenosis to the skull base. Left carotid system: Left CCA origin calcified plaque without stenosis. Mildly tortuous left CCA. Negative left carotid bifurcation. Tortuous left ICA distal to the bulb, no stenosis to the skull base. Vertebral arteries: Mild  plaque in the proximal right subclavian artery without stenosis. Normal right vertebral artery origin. Tortuous right V1 segment. Tortuous right V 2 segment. No right vertebral artery stenosis to the skull base. Proximal left subclavian artery calcified plaque without significant stenosis. Normal left vertebral artery origin. Tortuous left V1 through V3 segments with a mildly dominant left vertebral artery, no stenosis to the skull base. CTA HEAD Posterior circulation: Mildly dominant left V4 segment. Normal PICA origins and no distal vertebral plaque or stenosis. Patent vertebrobasilar junction and basilar artery without stenosis. Normal SCA and right PCA origins. Fetal type left PCA origin. Diminutive right posterior communicating artery. Bilateral PCA branches are within normal limits. Anterior circulation: Both ICA siphons are patent. On the left there is moderate calcified plaque at the anterior genu but only mild stenosis. On the right there is more intermittent calcified plaque, and mild to moderate anterior genu stenosis. Normal ophthalmic and posterior communicating artery origins. Patent carotid termini. Normal MCA and ACA origins. Anterior communicating artery and bilateral ACA branches are within normal limits. Left MCA M1 segment and bifurcation are patent without stenosis. Right MCA M1 segment and  bifurcation are patent without stenosis. Bilateral MCA branches are within normal limits. Venous sinuses: Patent. Anatomic variants: Mildly dominant left vertebral artery. Fetal left PCA origin. Review of the MIP images confirms the above findings IMPRESSION: 1. Negative for large vessel occlusion. 2. Positive for bilateral ICA siphon calcified plaque with up to Moderate Right and Mild Left intracranial ICA stenoses at the anterior genu. 3. Tortuous vessels but minimal atherosclerosis for age elsewhere, and no other arterial stenosis in the head or neck. 4. Aortic Atherosclerosis (ICD10-I70.0). Electronically Signed   By: Odessa Fleming M.D.   On: 04/03/2020 13:31   MR BRAIN WO CONTRAST  Result Date: 04/03/2020 CLINICAL DATA:  Stroke follow-up. Aphasia.  Received tPA yesterday. EXAM: MRI HEAD WITHOUT CONTRAST TECHNIQUE: Multiplanar, multiecho pulse sequences of the brain and surrounding structures were obtained without intravenous contrast. COMPARISON:  Head CT 04/02/2020 and MRI 10/13/2018 FINDINGS: Brain: There is no evidence of acute infarct, intracranial hemorrhage, mass, midline shift, or extra-axial fluid collection. Patchy and confluent T2 hyperintensities in the cerebral white matter and pons are similar to the prior MRI and are nonspecific but compatible with severe chronic small vessel ischemic disease. Chronic lacunar infarcts are again noted in the left thalamus and cerebellum. There is moderate cerebral atrophy. Vascular: Major intracranial vascular flow voids are preserved. Skull and upper cervical spine: Unremarkable bone marrow signal. Sinuses/Orbits: Bilateral cataract extraction. Clear paranasal sinuses. Moderate bilateral mastoid effusions. Other: None. IMPRESSION: 1. No acute intracranial abnormality. 2. Severe chronic small vessel ischemic disease. Electronically Signed   By: Sebastian Ache M.D.   On: 04/03/2020 13:17   ECHOCARDIOGRAM COMPLETE  Result Date: 04/03/2020    ECHOCARDIOGRAM REPORT    Patient Name:   JARAH PEMBER Date of Exam: 04/03/2020 Medical Rec #:  [id number]          Height:       62.0 in Accession #:    [id number]         Weight:       128.3 lb Date of Birth:  09/09/1926          BSA:          1.583 m Patient Age:    89 years           BP:           134/99 mmHg Patient Gender: F  HR:           87 bpm. Exam Location:  Inpatient Procedure: 2D Echo Indications:    stroke 434.91  History:        Patient has no prior history of Echocardiogram examinations.                 Risk Factors:Hypertension and Former Smoker.  Sonographer:    Jannett Celestine RDCS (AE) Referring Phys: [id number] SHARON L BIBY IMPRESSIONS  1. Left ventricular ejection fraction, by estimation, is 65 to 70%. The left ventricle has normal function. The left ventricle has no regional wall motion abnormalities. There is moderate left ventricular hypertrophy. Left ventricular diastolic parameters are indeterminate.  2. Right ventricular systolic function is normal. The right ventricular size is normal. Tricuspid regurgitation signal is inadequate for assessing PA pressure.  3. The mitral valve is normal in structure. No evidence of mitral valve regurgitation.  4. The aortic valve is tricuspid. Aortic valve regurgitation is not visualized. Mild aortic valve sclerosis is present, with no evidence of aortic valve stenosis. FINDINGS  Left Ventricle: Left ventricular ejection fraction, by estimation, is 65 to 70%. The left ventricle has normal function. The left ventricle has no regional wall motion abnormalities. The left ventricular internal cavity size was small. There is moderate  left ventricular hypertrophy. Left ventricular diastolic parameters are indeterminate. Right Ventricle: The right ventricular size is normal. Right vetricular wall thickness was not assessed. Right ventricular systolic function is normal. Tricuspid regurgitation signal is inadequate for assessing PA pressure. Left Atrium: Left atrial size  was normal in size. Right Atrium: Right atrial size was normal in size. Pericardium: Trivial pericardial effusion is present. Presence of pericardial fat pad. Mitral Valve: The mitral valve is normal in structure. No evidence of mitral valve regurgitation. Tricuspid Valve: The tricuspid valve is normal in structure. Tricuspid valve regurgitation is not demonstrated. Aortic Valve: The aortic valve is tricuspid. Aortic valve regurgitation is not visualized. Mild aortic valve sclerosis is present, with no evidence of aortic valve stenosis. Pulmonic Valve: The pulmonic valve was not well visualized. Pulmonic valve regurgitation is not visualized. Aorta: The aortic root is normal in size and structure. IAS/Shunts: The interatrial septum was not well visualized.  LEFT VENTRICLE PLAX 2D LVIDd:         3.20 cm LVIDs:         2.10 cm LV PW:         1.10 cm LV IVS:        1.10 cm LVOT diam:     1.90 cm LV SV:         68 LV SV Index:   43 LVOT Area:     2.84 cm  LEFT ATRIUM             Index       RIGHT [facility]           Index LA diam:        3.30 cm 2.08 cm/m  RA Area:     12.50 cm LA Vol (A2C):   26.4 ml 16.68 ml/m RA Volume:   26.70 ml  16.87 ml/m LA Vol (A4C):   24.9 ml 15.73 ml/m LA Biplane Vol: 28.0 ml 17.69 ml/m  AORTIC VALVE LVOT Vmax:   108.00 cm/s LVOT Vmean:  77.000 cm/s LVOT VTI:    0.240 m  AORTA Ao Root diam: 2.80 cm  SHUNTS Systemic VTI:  0.24 m Systemic Diam: 1.90 cm Oswaldo Milian MD Electronically signed  by Epifanio Lesches MD Signature Date/Time: 04/03/2020/9:54:13 PM    Final     LOS: 3 days   Time spent: More than 50% of that time was spent in counseling and/or coordination of care.  Lanae Boast, MD [facility]  04/04/2020, 2:31 PM

## 2020-04-04 NOTE — Progress Notes (Signed)
Occupational Therapy Evaluation Patient Details Name: Meghan Kim MRN: [id number] DOB: 1926/03/12 Today's Date: 04/04/2020    History of Present Illness Meghan Kim is a 86 y.o. female with medical history significant for hypertension, hypothyroidism, arthritis, and neuropathy, now presenting to the emergency department with acute onset of pain in her lower chest and upper abdomen along with nausea. Pt found to have acute cholecystitis and inflamed had of pancreas. Code stroke was then called as pt with slurred speech and word finding difficulty. IV tpa given on 5/9. Imaging negative for hemorrhage or acute infarct.   Clinical Impression   PTA, pt had personal care services with [facility] 2 x/day to help with getting bathed/dressed in the am and then getting ready for bed in the pm. Pt was modified independent at night with transfer to [facility] and hygiene after toileting. Pt required min guard A with mobility and ADL tasks this pm and states she knows she is weaker but feels comfortable going home. Recommend pt initially have S for all mobility and ADL tasks. Pt verbalized understanding and states she has reached out to [employer] to request more hours. Will follow acutely and recommend HHOT after DC.     Follow Up Recommendations  Home health OT;Supervision/Assistance - 24 hour(initially - for mobility and assistance with ADL)    Equipment Recommendations  None recommended by OT    Recommendations for Other Services       Precautions / Restrictions Precautions Precautions: Fall Precaution Comments: HOH, bilat hearing aides Restrictions Weight Bearing Restrictions: No      Mobility Bed Mobility               General bed mobility comments: pt up in chair upon OT arrival  Transfers Overall transfer level: Needs assistance Equipment used: Rolling walker (2 wheeled) Transfers: Sit to/from Stand Sit to Stand: Min guard         General transfer comment: good  awareness of safety    Balance Overall balance assessment: Needs assistance Sitting-balance support: Feet supported;No upper extremity supported Sitting balance-Leahy Scale: Fair     Standing balance support: Bilateral upper extremity supported;During functional activity Standing balance-Leahy Scale: Poor Standing balance comment: dependent on RW                           ADL either performed or assessed with clinical judgement   ADL Overall ADL's : Needs assistance/impaired Eating/Feeding: Set up   Grooming: Set up;Supervision/safety;Standing   Upper Body Bathing: Set up;Supervision/ safety;Sitting   Lower Body Bathing: Min guard;Sit to/from stand   Upper Body Dressing : Supervision/safety;Set up;Sitting   Lower Body Dressing: Minimal assistance;Sit to/from stand   Toilet Transfer: Min guard;Ambulation;RW;Comfort height toilet;Grab bars   Toileting- Clothing Manipulation and Hygiene: Supervision/safety;Sit to/from stand       Functional mobility during ADLs: Min guard;Rolling walker General ADL Comments: States at [place] her "helper" comes in the am to help her get ready for the day then helps her get undressed adn ready for bed at night. Pt independently gets herself "a foot and a half to the bedside commode" at night. Pt as able to recount recommendations from earlier PT session and has called [facility] to request more hours.      Vision Baseline Vision/History: Wears glasses       Perception     Praxis      Pertinent Vitals/Pain Pain Assessment: No/denies pain     Hand Dominance Right  Extremity/Trunk Assessment Upper Extremity Assessment Upper Extremity Assessment: Generalized weakness   Lower Extremity Assessment Lower Extremity Assessment: Defer to PT evaluation   Cervical / Trunk Assessment Cervical / Trunk Assessment: Kyphotic   Communication Communication Communication: HOH   Cognition Arousal/Alertness: Awake/alert Behavior  During Therapy: WFL for tasks assessed/performed Overall Cognitive Status: No family/caregiver present to determine baseline cognitive functioning                                 General Comments: suspect pt is near her baseline, pt very independent and is aware she is weaker now; has called [facility] to request more hours; aware of hospital events   General Comments  Talking about her puppy "Missy" who is curreently staying with her son who has 12 dogs. Said she likes to "run with the pack"    Exercises     Shoulder Instructions      Home Living Family/patient expects to be discharged to:: Private residence Living Arrangements: Alone Available Help at Discharge: Abie from 8-12 and 3-6 7 days a week.) Type of Home: House Home Access: Level entry     Home Layout: One level     Bathroom Shower/Tub: Occupational psychologist: Handicapped height Bathroom Accessibility: Yes How Accessible: Accessible via walker Home Equipment: [place] - 4 wheels;Bedside commode;Shower seat - built in;Grab bars - tub/shower;Grab bars - toilet;Wheelchair - manual;Hand held shower head(upright walker)          Prior Functioning/Environment Level of Independence: Needs assistance  Gait / Transfers Assistance Needed: HHPT has been seeing pt 2x/wk since Nov; HHOT 1x/wk, pt uses upright walker but recently has been using w/c due to weakness and abdominal pain, pt uses BSC at night but can walk into bathroom during the day ADL's / Homemaking Assistance Needed: hired care comes in to supervise ADLs, pt can bathe self but aide supervises, aide cooks her meals and does the grocery shopping, pt doesn't drive. pt cares for her small dog who is 13yo            OT Problem List: Decreased strength;Decreased activity tolerance;Impaired balance (sitting and/or standing);Cardiopulmonary status limiting activity      OT Treatment/Interventions:  Self-care/ADL training;Therapeutic exercise;Energy conservation;DME and/or AE instruction;Therapeutic activities;Patient/family education;Balance training    OT Goals(Current goals can be found in the care plan section) Acute Rehab OT Goals Patient Stated Goal: home tommorrow OT Goal Formulation: With patient Time For Goal Achievement: 04/18/20 Potential to Achieve Goals: Good  OT Frequency: Min 2X/week   Barriers to D/C:            Co-evaluation              AM-PAC OT "6 Clicks" Daily Activity     Outcome Measure Help from another person eating meals?: A Little Help from another person taking care of personal grooming?: A Little Help from another person toileting, which includes using toliet, bedpan, or urinal?: A Little Help from another person bathing (including washing, rinsing, drying)?: A Little Help from another person to put on and taking off regular upper body clothing?: A Little Help from another person to put on and taking off regular lower body clothing?: A Little 6 Click Score: 18   End of Session Equipment Utilized During Treatment: Gait belt;Rolling walker Nurse Communication: Mobility status  Activity Tolerance: Patient tolerated treatment well Patient left: in chair;with call bell/phone within reach  OT Visit Diagnosis: Unsteadiness on feet (R26.81);Other abnormalities of gait and mobility (R26.89);Muscle weakness (generalized) (M62.81)                Time: 5883-2549 OT Time Calculation (min): 23 min Charges:  OT General Charges $OT Visit: 1 Visit OT Evaluation $OT Eval Moderate Complexity: 1 Mod OT Treatments $Self Care/Home Management : 8-22 mins  Luisa Dago, OT/L   Acute OT Clinical Specialist Acute Rehabilitation Services Pager [phone] Office [phone]   [facility] 04/04/2020, 2:13 PM

## 2020-04-04 NOTE — Progress Notes (Signed)
SLP Cancellation Note  Patient Details Name: Meghan Kim MRN: [id number] DOB: 1925-12-29   Cancelled treatment:       Reason Eval/Treat Not Completed: SLP screened, no needs identified, will sign off  Jalasia Eskridge L. Samson Frederic, MA CCC/SLP Acute Rehabilitation Services Office number [phone] Pager [phone]  Blenda Mounts Laurice 04/04/2020, 4:53 PM

## 2020-04-04 NOTE — Social Work (Signed)
CSW met with pt at bedside. CSW introduced self and explained her role. CSW completed sbirt with pt.  Pt scored a 3 on the sbirt scale. Pt reports she will have 1 glass of wine once a week. Pt stated she did not have a problem with alcohol use and that her doctors allows her to have 1 glass of wine a week. Pt denied substance use. Pt did not need resources at this time.  Emeterio Reeve, Latanya Presser, [place] Social Worker [phone]

## 2020-04-05 LAB — COMPREHENSIVE METABOLIC PANEL
ALT: 126 U/L — ABNORMAL HIGH (ref 0–44)
AST: 43 U/L — ABNORMAL HIGH (ref 15–41)
Albumin: 2.8 g/dL — ABNORMAL LOW (ref 3.5–5.0)
Alkaline Phosphatase: 101 U/L (ref 38–126)
Anion gap: 10 (ref 5–15)
BUN: 10 mg/dL (ref 8–23)
CO2: 23 mmol/L (ref 22–32)
Calcium: 8.8 mg/dL — ABNORMAL LOW (ref 8.9–10.3)
Chloride: 98 mmol/L (ref 98–111)
Creatinine, Ser: 0.5 mg/dL (ref 0.44–1.00)
GFR calc Af Amer: >60 mL/min (ref >60)
GFR calc non Af Amer: >60 mL/min (ref >60)
Glucose, Bld: 91 mg/dL (ref 70–99)
Potassium: 4.2 mmol/L (ref 3.5–5.1)
Sodium: 131 mmol/L — ABNORMAL LOW (ref 135–145)
Total Bilirubin: 0.7 mg/dL (ref 0.3–1.2)
Total Protein: 5.4 g/dL — ABNORMAL LOW (ref 6.5–8.1)

## 2020-04-05 MED ORDER — ASPIRIN 81 MG PO TBEC: 81.0000 mg | DELAYED_RELEASE_TABLET | Freq: Every day | ORAL | 3 refills | Status: AC

## 2020-04-05 NOTE — TOC Transition Note (Addendum)
Transition of Care [facility]) - CM/SW Discharge Note   Patient Details  Name: Meghan Kim MRN: [id number] Date of Birth: August 17, 1926  [facility] CM/SW Contact:  Glennon Mac, RN Phone Number: 04/05/2020, 3:00 PM   Clinical Narrative: Al Gagen is a 86 y.o. female with medical history significant for hypertension, hypothyroidism, arthritis, and neuropathy, now presenting to the emergency department with acute onset of pain in her lower chest and upper abdomen along with nausea. Pt found to have acute cholecystitis and inflamed had of pancreas. Code stroke was then called as pt with slurred speech and word finding difficulty. IV tpa given on 5/9. Imaging negative for hemorrhage or acute infarct.  PTA, pt requires assistance with ADLS; she has [facility] [employer])  that comes in twice a day to assist her with ADLS.  She states that aide or her son can stay with her 24/7 when she first goes home.  Pt active with [facility] for HHPT/OT, and wishes to resume services with this agency; notified Tiffany with [facility] of resumption of care orders for HHPT/OT.  No DME needs per pt/therapies.     Final next level of care: Home w Home Health Services Barriers to Discharge: Barriers Resolved   Patient Goals and CMS Choice Patient states their goals for this hospitalization and ongoing recovery are:: to get back home to my dog CMS Medicare.gov Compare Post Acute Care list provided to:: Patient Choice offered to / list presented to : Patient                        Discharge Plan and Services   Discharge Planning Services: CM Consult Post Acute Care Choice: Home Health, Resumption of Svcs/PTA Provider                    HH Arranged: PT, RN, OT, Nurse's Aide HH Agency: [facility] (now [facility]) Date HH Agency Contacted: 04/05/20 Time HH Agency Contacted: [phone] Representative spoke with at [facility] Agency: Dara Hoyer  Social Determinants of  Health (SDOH) Interventions     Readmission Risk Interventions Readmission Risk Prevention Plan 04/05/2020  Post Dischage Appt Complete  Medication Screening Complete  Transportation Screening Complete   Quintella Baton, RN, BSN  Trauma/Neuro ICU Case Manager [phone]

## 2020-04-05 NOTE — Discharge Summary (Signed)
Physician Discharge Summary  Meghan Kim ZOX:[id number] DOB: January 15, 1926 DOA: 04/01/2020  PCP: [facility]  Admit date: 04/01/2020 Discharge date: 04/05/2020  Admitted From: Home Disposition: Home  Recommendations for Outpatient Follow-up:  1. Follow up with PCP in 1-2 weeks 2. Please obtain BMP/CBC in one week your next doctors visit.  3. Aspirin 81 mg daily 4. Follow-up outpatient neurology in 4 weeks  Discharge Condition: Stable CODE STATUS: DNR Diet recommendation: Soft diet  Brief/Interim Summary: 86 year old with history of HTN, hypothyroidism, OSA, neuropathy [facility] with complaints of abdominal pain.  Initially noted to be hypertensive, hyponatremia, transaminitis, elevated LFT and leukocytosis.  Diagnosed with acute cholecystitis started on Rocephin and Flagyl.  Hospital course complicated by dysarthria, neuro consulted status post TPA 5/9.  MRCP was negative for cholecystitis but showed pancreatitis.  This was conservatively managed initially was Zosyn that was discontinued.  Over the course of time her symptoms resolved she started doing medically much better.  Physical therapy recommended home health therefore arrangements made.  Stable for discharge with outpatient follow-up.   Transaminitis/elevated LFT secondary to acute pancreatitis. MRCP consistent with mild pancreatitis, uncomplicated.  Conservative management.  Tolerating p.o. diet.  Currently off antibiotics.  GI signed off.  Aphasia/dysarthria 04/02/20 CT head showed small vessel disease.  CTA head and neck negative for LVO.  MRI negative for infarct.  Seen by neurology. Echocardiogram showed EF of 65 to 70% LDL 72, A1c 5.2. PT OT recommending home health PT. Speech therapy-signed off Continue 81 mg of aspirin upon discharge and follow-up with neuro clinic in 6 weeks  Hypothyroidism: Continue Synthroid 50 mcg daily  Peripheral neuropathy-continue  gabapentin  Essential hypertension : Blood pressure is labile.  Holding home losartan, lisinopril and Lasix for permissive hypertension.  Hyponatremia acute on chronic: Improved with IV fluids.  Discharge Diagnoses:  Principal Problem:   Acute cholecystitis Active Problems:   Thyroid disease   Hypertension   Chronic hyponatremia   Acute pancreatitis   Transaminitis    Consultations:  Neurology  GI  Subjective: Feels great sitting up in a chair and wants to go home.  Discharge Exam: Vitals:   04/05/20 1000 04/05/20 1119  BP:  (!) 152/117  Pulse: 78 74  Resp: (!) 21 (!) 24  Temp:    SpO2: 95% 95%   Vitals:   04/05/20 0800 04/05/20 0900 04/05/20 1000 04/05/20 1119  BP: 120/78 (!) 130/59  (!) 152/117  Pulse: 76 74 78 74  Resp: (!) 23 17 (!) 21 (!) 24  Temp: (!) 97.2 F (36.2 C)     TempSrc: Oral     SpO2: 97% 100% 95% 95%  Weight:      Height:        General: Pt is alert, awake, not in acute distress, elderly frail Cardiovascular: RRR, S1/S2 +, no rubs, no gallops Respiratory: CTA bilaterally, no wheezing, no rhonchi Abdominal: Soft, NT, ND, bowel sounds + Extremities: no edema, no cyanosis  Discharge Instructions   Allergies as of 04/05/2020      Reactions   Latex Itching, Rash   Penicillins Swelling   "internal swelling"   Tetanus Toxoid, Adsorbed Swelling   Sulfamethoxazole-trimethoprim Other (See Comments)   Unknown reaction   Adhesive [tape] Other (See Comments)   Causes skin tears   Morphine Nausea Only      Medication List    TAKE these medications   acetaminophen 650 MG CR tablet Commonly known as: TYLENOL Take 1,300 mg by  mouth See admin instructions. Take 2 tablets (1300 mg) by mouth daily at bedtime, may also take 2 tablets (1300 mg) every 6 hours as needed for pain   alendronate 70 MG tablet Commonly known as: FOSAMAX Take 70 mg by mouth every Friday.   ammonium lactate 12 % lotion Commonly known as: LAC-HYDRIN Apply 1  application topically 2 (two) times daily as needed (arm and hand itching).   aspirin 81 MG EC tablet Take 1 tablet (81 mg total) by mouth daily. Start taking on: Apr 06, 2020   clobetasol 0.05 % external solution Commonly known as: TEMOVATE Apply 1 application topically 2 (two) times daily as needed (scalp itching).   docusate sodium 100 MG capsule Commonly known as: COLACE Take 100 mg by mouth daily with breakfast.   Dupixent 300 MG/2ML prefilled syringe Generic drug: dupilumab Inject 300 mg into the skin every 14 (fourteen) days. Every other Saturday   FISH OIL PO Take 1 capsule by mouth daily with breakfast.   fluticasone 50 MCG/ACT nasal spray Commonly known as: FLONASE Place 2 sprays into both nostrils 2 (two) times daily.   furosemide 40 MG tablet Commonly known as: LASIX Take 40 mg by mouth daily as needed (weight gain of 3 lbs or as directed by MD).   gabapentin 300 MG capsule Commonly known as: NEURONTIN Take 300 mg by mouth at bedtime.   GLUCOSAMINE-CHONDROITIN PO Take 1 tablet by mouth daily with breakfast.   levothyroxine 50 MCG tablet Commonly known as: SYNTHROID Take 50 mcg by mouth daily before breakfast.   MAGNESIUM PO Take 1 tablet by mouth at bedtime.   mirabegron ER 25 MG Tb24 tablet Commonly known as: MYRBETRIQ Take 25 mg by mouth daily with breakfast.   Multivitamin Gummies Womens Chew Chew 2 tablets by mouth daily with breakfast.   oxyCODONE 5 MG immediate release tablet Commonly known as: Oxy IR/ROXICODONE Take 5 mg by mouth 2 (two) times daily as needed for severe pain.   polyethylene glycol 17 g packet Commonly known as: MIRALAX / GLYCOLAX Take 17 g by mouth daily with breakfast. Mix in 4-8 oz water and drink   temazepam 15 MG capsule Commonly known as: RESTORIL Take 15 mg by mouth at bedtime.   valsartan 80 MG tablet Commonly known as: DIOVAN Take 80 mg by mouth daily with breakfast.   WAL-FEX PO Take 1 tablet by mouth  daily with breakfast.      Follow-up Information    Garvin Fila, MD. Schedule an appointment as soon as possible for a visit in 3 week(s).   Specialties: Neurology, Radiology Contact information: [address] [place] [address] [place], [facility]. Schedule an appointment as soon as possible for a visit in 1 week(s).   Contact information: [address]  [address] [place] [address] [phone]          Allergies  Allergen Reactions  . Latex Itching and Rash  . Penicillins Swelling    "internal swelling"  . Tetanus Toxoid, Adsorbed Swelling  . Sulfamethoxazole-Trimethoprim Other (See Comments)    Unknown reaction   . Adhesive [Tape] Other (See Comments)    Causes skin tears  . Morphine Nausea Only    You were cared for by a hospitalist during your hospital stay. If you have any questions about your discharge medications or the care you received while you were in the hospital after you are discharged, you can  call the unit and asked to speak with the hospitalist on call if the hospitalist that took care of you is not available. Once you are discharged, your primary care physician will handle any further medical issues. Please note that no refills for any discharge medications will be authorized once you are discharged, as it is imperative that you return to your primary care physician (or establish a relationship with a primary care physician if you do not have one) for your aftercare needs so that they can reassess your need for medications and monitor your lab values.   Procedures/Studies: CT ANGIO HEAD W OR WO CONTRAST  Result Date: 04/03/2020 CLINICAL DATA:  86 year old female code stroke presentation on 04/02/2020. Status post IV tPA. MRI earlier today without convincing acute infarct. EXAM: CT ANGIOGRAPHY HEAD AND NECK TECHNIQUE: Multidetector CT imaging of the head and neck was performed using the  standard protocol during bolus administration of intravenous contrast. Multiplanar CT image reconstructions and MIPs were obtained to evaluate the vascular anatomy. Carotid stenosis measurements (when applicable) are obtained utilizing NASCET criteria, using the distal internal carotid diameter as the denominator. CONTRAST:  50 mL Omnipaque 350 COMPARISON:  Brain MRI earlier today. Head CT yesterday. FINDINGS: CTA NECK Skeleton: Degenerative changes throughout the cervical spine with multilevel ankylosis. No acute osseous abnormality identified. Upper chest: Negative. Other neck: No acute findings. Aortic arch: Calcified aortic atherosclerosis. 3 vessel arch configuration. Right carotid system: No brachiocephalic artery or right CCA origin stenosis despite plaque. Mildly tortuous right CCA. Negative right carotid bifurcation. Tortuous cervical right ICA distal to the bulb with a kinked appearance (series 11, image 19), but no stenosis to the skull base. Left carotid system: Left CCA origin calcified plaque without stenosis. Mildly tortuous left CCA. Negative left carotid bifurcation. Tortuous left ICA distal to the bulb, no stenosis to the skull base. Vertebral arteries: Mild plaque in the proximal right subclavian artery without stenosis. Normal right vertebral artery origin. Tortuous right V1 segment. Tortuous right V 2 segment. No right vertebral artery stenosis to the skull base. Proximal left subclavian artery calcified plaque without significant stenosis. Normal left vertebral artery origin. Tortuous left V1 through V3 segments with a mildly dominant left vertebral artery, no stenosis to the skull base. CTA HEAD Posterior circulation: Mildly dominant left V4 segment. Normal PICA origins and no distal vertebral plaque or stenosis. Patent vertebrobasilar junction and basilar artery without stenosis. Normal SCA and right PCA origins. Fetal type left PCA origin. Diminutive right posterior communicating artery.  Bilateral PCA branches are within normal limits. Anterior circulation: Both ICA siphons are patent. On the left there is moderate calcified plaque at the anterior genu but only mild stenosis. On the right there is more intermittent calcified plaque, and mild to moderate anterior genu stenosis. Normal ophthalmic and posterior communicating artery origins. Patent carotid termini. Normal MCA and ACA origins. Anterior communicating artery and bilateral ACA branches are within normal limits. Left MCA M1 segment and bifurcation are patent without stenosis. Right MCA M1 segment and bifurcation are patent without stenosis. Bilateral MCA branches are within normal limits. Venous sinuses: Patent. Anatomic variants: Mildly dominant left vertebral artery. Fetal left PCA origin. Review of the MIP images confirms the above findings IMPRESSION: 1. Negative for large vessel occlusion. 2. Positive for bilateral ICA siphon calcified plaque with up to Moderate Right and Mild Left intracranial ICA stenoses at the anterior genu. 3. Tortuous vessels but minimal atherosclerosis for age elsewhere, and no other arterial stenosis in the head or  neck. 4. Aortic Atherosclerosis (ICD10-I70.0). Electronically Signed   By: Odessa Fleming M.D.   On: 04/03/2020 13:31   CT ANGIO NECK W OR WO CONTRAST  Result Date: 04/03/2020 CLINICAL DATA:  86 year old female code stroke presentation on 04/02/2020. Status post IV tPA. MRI earlier today without convincing acute infarct. EXAM: CT ANGIOGRAPHY HEAD AND NECK TECHNIQUE: Multidetector CT imaging of the head and neck was performed using the standard protocol during bolus administration of intravenous contrast. Multiplanar CT image reconstructions and MIPs were obtained to evaluate the vascular anatomy. Carotid stenosis measurements (when applicable) are obtained utilizing NASCET criteria, using the distal internal carotid diameter as the denominator. CONTRAST:  50 mL Omnipaque 350 COMPARISON:  Brain MRI  earlier today. Head CT yesterday. FINDINGS: CTA NECK Skeleton: Degenerative changes throughout the cervical spine with multilevel ankylosis. No acute osseous abnormality identified. Upper chest: Negative. Other neck: No acute findings. Aortic arch: Calcified aortic atherosclerosis. 3 vessel arch configuration. Right carotid system: No brachiocephalic artery or right CCA origin stenosis despite plaque. Mildly tortuous right CCA. Negative right carotid bifurcation. Tortuous cervical right ICA distal to the bulb with a kinked appearance (series 11, image 19), but no stenosis to the skull base. Left carotid system: Left CCA origin calcified plaque without stenosis. Mildly tortuous left CCA. Negative left carotid bifurcation. Tortuous left ICA distal to the bulb, no stenosis to the skull base. Vertebral arteries: Mild plaque in the proximal right subclavian artery without stenosis. Normal right vertebral artery origin. Tortuous right V1 segment. Tortuous right V 2 segment. No right vertebral artery stenosis to the skull base. Proximal left subclavian artery calcified plaque without significant stenosis. Normal left vertebral artery origin. Tortuous left V1 through V3 segments with a mildly dominant left vertebral artery, no stenosis to the skull base. CTA HEAD Posterior circulation: Mildly dominant left V4 segment. Normal PICA origins and no distal vertebral plaque or stenosis. Patent vertebrobasilar junction and basilar artery without stenosis. Normal SCA and right PCA origins. Fetal type left PCA origin. Diminutive right posterior communicating artery. Bilateral PCA branches are within normal limits. Anterior circulation: Both ICA siphons are patent. On the left there is moderate calcified plaque at the anterior genu but only mild stenosis. On the right there is more intermittent calcified plaque, and mild to moderate anterior genu stenosis. Normal ophthalmic and posterior communicating artery origins. Patent carotid  termini. Normal MCA and ACA origins. Anterior communicating artery and bilateral ACA branches are within normal limits. Left MCA M1 segment and bifurcation are patent without stenosis. Right MCA M1 segment and bifurcation are patent without stenosis. Bilateral MCA branches are within normal limits. Venous sinuses: Patent. Anatomic variants: Mildly dominant left vertebral artery. Fetal left PCA origin. Review of the MIP images confirms the above findings IMPRESSION: 1. Negative for large vessel occlusion. 2. Positive for bilateral ICA siphon calcified plaque with up to Moderate Right and Mild Left intracranial ICA stenoses at the anterior genu. 3. Tortuous vessels but minimal atherosclerosis for age elsewhere, and no other arterial stenosis in the head or neck. 4. Aortic Atherosclerosis (ICD10-I70.0). Electronically Signed   By: Odessa Fleming M.D.   On: 04/03/2020 13:31   MR BRAIN WO CONTRAST  Result Date: 04/03/2020 CLINICAL DATA:  Stroke follow-up. Aphasia.  Received tPA yesterday. EXAM: MRI HEAD WITHOUT CONTRAST TECHNIQUE: Multiplanar, multiecho pulse sequences of the brain and surrounding structures were obtained without intravenous contrast. COMPARISON:  Head CT 04/02/2020 and MRI 10/13/2018 FINDINGS: Brain: There is no evidence of acute infarct, intracranial hemorrhage, mass,  midline shift, or extra-axial fluid collection. Patchy and confluent T2 hyperintensities in the cerebral white matter and pons are similar to the prior MRI and are nonspecific but compatible with severe chronic small vessel ischemic disease. Chronic lacunar infarcts are again noted in the left thalamus and cerebellum. There is moderate cerebral atrophy. Vascular: Major intracranial vascular flow voids are preserved. Skull and upper cervical spine: Unremarkable bone marrow signal. Sinuses/Orbits: Bilateral cataract extraction. Clear paranasal sinuses. Moderate bilateral mastoid effusions. Other: None. IMPRESSION: 1. No acute intracranial  abnormality. 2. Severe chronic small vessel ischemic disease. Electronically Signed   By: Sebastian Ache M.D.   On: 04/03/2020 13:17   CT ABDOMEN PELVIS W CONTRAST  Result Date: 04/01/2020 CLINICAL DATA:  Acute abdominal pain. EXAM: CT ABDOMEN AND PELVIS WITH CONTRAST TECHNIQUE: Multidetector CT imaging of the abdomen and pelvis was performed using the standard protocol following bolus administration of intravenous contrast. CONTRAST:  26mL OMNIPAQUE IOHEXOL 300 MG/ML  SOLN COMPARISON:  February 11, 2017. FINDINGS: Lower chest: The lung bases are clear. The heart size is normal. Hepatobiliary: The liver is normal. The gallbladder is significantly distended. There are pericholecystic inflammatory changes.There is significant intrahepatic and extrahepatic biliary ductal dilatation. The common bile duct measures up to approximately 1.4 cm in diameter. Pancreas: The pancreas is atrophic. There are mild inflammatory changes near the head of the pancreas. Spleen: Unremarkable. Adrenals/Urinary Tract: --Adrenal glands: Unremarkable. --Right kidney/ureter: No hydronephrosis or radiopaque kidney stones. --Left kidney/ureter: No hydronephrosis or radiopaque kidney stones. --Urinary bladder: The urinary bladder is significantly distended. Stomach/Bowel: --Stomach/Duodenum: No hiatal hernia or other gastric abnormality. Normal duodenal course and caliber. --Small bowel: There are inflammatory changes surrounding the third and fourth portions of the duodenum. Duodenal diverticula are noted. --Colon: Rectosigmoid diverticulosis without acute inflammation. --Appendix: Normal. Vascular/Lymphatic: Atherosclerotic calcification is present within the non-aneurysmal abdominal aorta, without hemodynamically significant stenosis. Heavy calcifications are noted in the mid to distal aorta likely resulting in some mild to moderate stenosis. --No retroperitoneal lymphadenopathy. --No mesenteric lymphadenopathy. --No pelvic or inguinal  lymphadenopathy. Reproductive: There prominent pelvic veins bilaterally. Other: No ascites or free air. The abdominal wall is normal. Musculoskeletal. There is an age-indeterminate compression fracture of the L4 vertebral body, new since 2018. Multilevel degenerative changes are noted throughout the lumbar spine. There is an old fracture of the inferior pubic ramus on the right with evidence for nonunion. There are old fractures involving the right superior pubic ramus and anterior column of the right acetabulum. Bilateral sacral fractures are noted status post bilateral sacral fixation. The patient is status post prior intramedullary nail placement through the left femur. There are end-stage degenerative changes of the left hip. There are moderate degenerative changes of the right hip. IMPRESSION: 1. Significantly distended gallbladder with pericholecystic inflammatory changes and significant intrahepatic and extrahepatic biliary ductal dilatation. Findings are concerning for acute cholecystitis or choledocholithiasis in the appropriate clinical setting. Consider further evaluation with ultrasound and MRCP as clinically indicated. 2. Inflammatory changes surrounding the head of the pancreas and duodenum. Findings may be secondary to acute interstitial pancreatitis or duodenitis. Correlation with laboratory studies is recommended. 3. Rectosigmoid diverticulosis without acute inflammation. 4. Age-indeterminate compression fracture of the L4 vertebral body, new since 2018. 5. Old pelvic fractures as above. Aortic Atherosclerosis (ICD10-I70.0). Electronically Signed   By: Katherine Mantle M.D.   On: 04/01/2020 22:50   DG Chest Port 1 View  Result Date: 04/01/2020 CLINICAL DATA:  Chest discomfort since lunch, previous tobacco abuse EXAM: PORTABLE CHEST 1 VIEW  COMPARISON:  08/19/2018 FINDINGS: Single frontal view of the chest demonstrates an unremarkable cardiac silhouette. Background interstitial prominence  consistent with history of tobacco abuse. No airspace disease, effusion, or pneumothorax. No acute bony abnormalities. IMPRESSION: 1. No acute intrathoracic process. Electronically Signed   By: Sharlet SalinaMichael  Brown M.D.   On: 04/01/2020 20:04   MR ABDOMEN MRCP W WO CONTAST  Result Date: 04/02/2020 CLINICAL DATA:  Pancreatitis suspected. Concern for choledocholithiasis. EXAM: MRI ABDOMEN WITHOUT AND WITH CONTRAST (INCLUDING MRCP) TECHNIQUE: Multiplanar multisequence MR imaging of the abdomen was performed both before and after the administration of intravenous contrast. Heavily T2-weighted images of the biliary and pancreatic ducts were obtained, and three-dimensional MRCP images were rendered by post processing. CONTRAST:  5.315mL GADAVIST GADOBUTROL 1 MMOL/ML IV SOLN COMPARISON:  CT 04/01/2020 FINDINGS: Lower chest:  Lung bases are clear. Hepatobiliary: No intrahepatic biliary duct dilatation. The common hepatic duct measures 12 mm. The common bile duct measures 8 mm. There is no filling defect within the common bile duct. The duct dilatation extends to the ampulla. The gallbladder is elongated and mildly distended to 4.0 cm. No gallbladder wall thickening. No gallstones noted. Small amount pericholecystic fluid (image 20/5). Pancreas: Pancreatic duct is normal caliber. No variant pancreatic ductal anatomy. There is edema within the pancreatic parenchyma. This edema is more concentrated towards the head of the pancreas (image 18/11). There is small amount of fluid in Morrison's pouch and along the second portion duodenum (image 19/5). No organized fluid collections. Spleen: Normal spleen. Adrenals/urinary tract: Adrenal glands and kidneys are normal. Stomach/Bowel: Stomach and limited of the small bowel is unremarkable Vascular/Lymphatic: Abdominal aortic normal caliber. No retroperitoneal periportal lymphadenopathy. Musculoskeletal: No aggressive osseous lesion IMPRESSION: 1. Findings most suggestive of mild acute  pancreatitis. No organized fluid collections. 2. Dilatation of the common hepatic duct and common bile duct without obstructing lesion identified. No choledocholithiasis. Favor combination of chronic biliary duct dilatation and potential dilatation related to pancreatitis. 3. No gallstones present. No gallbladder wall thickening. Small amount of Peri cholecystic fluid is favored related to pancreatic inflammation. Electronically Signed   By: Genevive BiStewart  Edmunds M.D.   On: 04/02/2020 08:20   ECHOCARDIOGRAM COMPLETE  Result Date: 04/03/2020    ECHOCARDIOGRAM REPORT   Patient Name:   Meghan Kim Date of Exam: 04/03/2020 Medical Rec #:  [id number]          Height:       62.0 in Accession #:    [id number]         Weight:       128.3 lb Date of Birth:  03-20-1926          BSA:          1.583 m Patient Age:    89 years           BP:           134/99 mmHg Patient Gender: F                  HR:           87 bpm. Exam Location:  Inpatient Procedure: 2D Echo Indications:    stroke 434.91  History:        Patient has no prior history of Echocardiogram examinations.                 Risk Factors:Hypertension and Former Smoker.  Sonographer:    Celene SkeenVijay Shankar RDCS (AE) Referring Phys: [id number] SHARON L BIBY IMPRESSIONS  1. Left ventricular  ejection fraction, by estimation, is 65 to 70%. The left ventricle has normal function. The left ventricle has no regional wall motion abnormalities. There is moderate left ventricular hypertrophy. Left ventricular diastolic parameters are indeterminate.  2. Right ventricular systolic function is normal. The right ventricular size is normal. Tricuspid regurgitation signal is inadequate for assessing PA pressure.  3. The mitral valve is normal in structure. No evidence of mitral valve regurgitation.  4. The aortic valve is tricuspid. Aortic valve regurgitation is not visualized. Mild aortic valve sclerosis is present, with no evidence of aortic valve stenosis. FINDINGS  Left Ventricle: Left  ventricular ejection fraction, by estimation, is 65 to 70%. The left ventricle has normal function. The left ventricle has no regional wall motion abnormalities. The left ventricular internal cavity size was small. There is moderate  left ventricular hypertrophy. Left ventricular diastolic parameters are indeterminate. Right Ventricle: The right ventricular size is normal. Right vetricular wall thickness was not assessed. Right ventricular systolic function is normal. Tricuspid regurgitation signal is inadequate for assessing PA pressure. Left Atrium: Left atrial size was normal in size. Right Atrium: Right atrial size was normal in size. Pericardium: Trivial pericardial effusion is present. Presence of pericardial fat pad. Mitral Valve: The mitral valve is normal in structure. No evidence of mitral valve regurgitation. Tricuspid Valve: The tricuspid valve is normal in structure. Tricuspid valve regurgitation is not demonstrated. Aortic Valve: The aortic valve is tricuspid. Aortic valve regurgitation is not visualized. Mild aortic valve sclerosis is present, with no evidence of aortic valve stenosis. Pulmonic Valve: The pulmonic valve was not well visualized. Pulmonic valve regurgitation is not visualized. Aorta: The aortic root is normal in size and structure. IAS/Shunts: The interatrial septum was not well visualized.  LEFT VENTRICLE PLAX 2D LVIDd:         3.20 cm LVIDs:         2.10 cm LV PW:         1.10 cm LV IVS:        1.10 cm LVOT diam:     1.90 cm LV SV:         68 LV SV Index:   43 LVOT Area:     2.84 cm  LEFT ATRIUM             Index       RIGHT ATRIUM           Index LA diam:        3.30 cm 2.08 cm/m  RA Area:     12.50 cm LA Vol (A2C):   26.4 ml 16.68 ml/m RA Volume:   26.70 ml  16.87 ml/m LA Vol (A4C):   24.9 ml 15.73 ml/m LA Biplane Vol: 28.0 ml 17.69 ml/m  AORTIC VALVE LVOT Vmax:   108.00 cm/s LVOT Vmean:  77.000 cm/s LVOT VTI:    0.240 m  AORTA Ao Root diam: 2.80 cm  SHUNTS Systemic VTI:   0.24 m Systemic Diam: 1.90 cm Epifanio Lesches MD Electronically signed by Epifanio Lesches MD Signature Date/Time: 04/03/2020/9:54:13 PM    Final    CT HEAD CODE STROKE WO CONTRAST  Result Date: 04/02/2020 CLINICAL DATA:  Code stroke.  Additional history provided: Aphasia. EXAM: CT HEAD WITHOUT CONTRAST TECHNIQUE: Contiguous axial images were obtained from the base of the skull through the vertex without intravenous contrast. COMPARISON:  Brain MRI 10/13/2018 FINDINGS: Brain: There is advanced ill-defined hypoattenuation within cerebral white matter which is nonspecific, but consistent with chronic small vessel  ischemic disease. Chronic left thalamic lacunar infarct. Known chronic lacunar infarcts within the cerebellum were better appreciated on prior MRI 10/13/2018. There is no acute intracranial hemorrhage. No demarcated cortical infarct. No extra-axial fluid collection. No evidence of intracranial mass. No midline shift. Vascular: No hyperdense vessel.  Atherosclerotic calcifications. Skull: Normal. Negative for fracture or focal lesion. Sinuses/Orbits: Visualized orbits show no acute finding. No significant paranasal sinus disease at the imaged levels. Small bilateral mastoid effusions. ASPECTS (Alberta Stroke Program Early CT Score) - Ganglionic level infarction (caudate, lentiform nuclei, internal capsule, insula, M1-M3 cortex): 7 - Supraganglionic infarction (M4-M6 cortex): 3 Total score (0-10 with 10 being normal): 10 These results were communicated to Dr. Otelia Limes At 10:34 amon 5/9/2021by text page via the [facility] messaging system. IMPRESSION: 1. No evidence of acute intracranial abnormality. ASPECTS is 10. 2. Advanced chronic small vessel ischemic changes within the cerebral white matter. Chronic lacunar infarcts within the left thalamus and within the cerebellum. 3. Moderate generalized parenchymal atrophy. 4. Small bilateral mastoid effusions. Electronically Signed   By: Jackey Loge DO   On:  04/02/2020 10:35   US Abdomen Limited RUQ  Result Date: 04/01/2020 CLINICAL DATA:  Postprandial abdominal pain. EXAM: ULTRASOUND ABDOMEN LIMITED RIGHT UPPER QUADRANT COMPARISON:  None. FINDINGS: Gallbladder: No gallstones or wall thickening visualized (2.2 mm). No sonographic Murphy sign noted by sonographer. Common bile duct: Diameter: 6.5 mm Liver: No focal lesion identified. Within normal limits in parenchymal echogenicity. Portal vein is patent on color Doppler imaging with normal direction of blood flow towards the liver. Other: It should be noted that the study is limited secondary to overlying bowel gas. IMPRESSION: 1. Limited study secondary to overlying bowel gas. 2. Otherwise unremarkable right upper quadrant ultrasound. Electronically Signed   By: Aram Candela M.D.   On: 04/01/2020 21:01      The results of significant diagnostics from this hospitalization (including imaging, microbiology, ancillary and laboratory) are listed below for reference.     Microbiology: Recent Results (from the past 240 hour(s))  Respiratory Panel by RT PCR (Flu A&B, Covid) - Nasopharyngeal Swab     Status: None   Collection Time: 04/01/20 10:08 PM   Specimen: Nasopharyngeal Swab  Result Value Ref Range Status   SARS Coronavirus 2 by RT PCR NEGATIVE NEGATIVE Final    Comment: (NOTE) SARS-CoV-2 target nucleic acids are NOT DETECTED. The SARS-CoV-2 RNA is generally detectable in upper respiratoy specimens during the acute phase of infection. The lowest concentration of SARS-CoV-2 viral copies this assay can detect is 131 copies/mL. A negative result does not preclude SARS-Cov-2 infection and should not be used as the sole basis for treatment or other patient management decisions. A negative result may occur with  improper specimen collection/handling, submission of specimen other than nasopharyngeal swab, presence of viral mutation(s) within the areas targeted by this assay, and inadequate number  of viral copies (<131 copies/mL). A negative result must be combined with clinical observations, patient history, and epidemiological information. The expected result is Negative. Fact Sheet for Patients:  https://www.moore.com/ Fact Sheet for Healthcare Providers:  https://www.young.biz/ This test is not yet ap proved or cleared by the [place] FDA and  has been authorized for detection and/or diagnosis of SARS-CoV-2 by FDA under an Emergency Use Authorization (EUA). This EUA will remain  in effect (meaning this test can be used) for the duration of the COVID-19 declaration under Section 564(b)(1) of the Act, 21 U.S.C. section 360bbb-3(b)(1), unless the authorization is terminated or revoked sooner.  Influenza A by PCR NEGATIVE NEGATIVE Final   Influenza B by PCR NEGATIVE NEGATIVE Final    Comment: (NOTE) The Xpert Xpress SARS-CoV-2/FLU/RSV assay is intended as an aid in  the diagnosis of influenza from Nasopharyngeal swab specimens and  should not be used as a sole basis for treatment. Nasal washings and  aspirates are unacceptable for Xpert Xpress SARS-CoV-2/FLU/RSV  testing. Fact Sheet for Patients: https://www.moore.com/ Fact Sheet for Healthcare Providers: https://www.young.biz/ This test is not yet approved or cleared by the [place] FDA and  has been authorized for detection and/or diagnosis of SARS-CoV-2 by  FDA under an Emergency Use Authorization (EUA). This EUA will remain  in effect (meaning this test can be used) for the duration of the  Covid-19 declaration under Section 564(b)(1) of the Act, 21  U.S.C. section 360bbb-3(b)(1), unless the authorization is  terminated or revoked. Performed at [facility], [address] [place], [place] [address]   MRSA PCR Screening     Status: Abnormal   Collection Time: 04/02/20 12:13 PM   Specimen: Nasopharyngeal  Result Value  Ref Range Status   MRSA by PCR POSITIVE (A) NEGATIVE Final    Comment:        The GeneXpert MRSA Assay (FDA approved for NASAL specimens only), is one component of a comprehensive MRSA colonization surveillance program. It is not intended to diagnose MRSA infection nor to guide or monitor treatment for MRSA infections. RESULT CALLED TO, READ BACK BY AND VERIFIED WITH: RN MARISSA ELIZONDA 1557 04/02/20 KB      Labs: BNP (last 3 results) No results for input(s): BNP in the last 8760 hours. Basic Metabolic Panel: Recent Labs  Lab 04/01/20 1923 04/02/20 0319 04/03/20 0642 04/04/20 0639 04/05/20 0447  NA 123* 129* 131* 131* 131*  K 4.3 4.2 3.5 3.5 4.2  CL 92* 96* 101 97* 98  CO2 21* 23 22 21* 23  GLUCOSE 141* 110* 84 75 91  BUN 12 7* 8 8 10   CREATININE 0.39* 0.42* 0.53 0.49 0.50  CALCIUM 9.0 8.8* 8.3* 8.6* 8.8*   Liver Function Tests: Recent Labs  Lab 04/01/20 1923 04/02/20 0319 04/03/20 0642 04/04/20 0639 04/05/20 0447  AST 1,342* 736* 165* 75* 43*  ALT 596* 565* 238* 169* 126*  ALKPHOS 188* 160* 113 104 101  BILITOT 1.1 1.1 0.7 0.6 0.7  PROT 6.8 6.0* 5.4* 5.6* 5.4*  ALBUMIN 3.8 3.2* 2.7* 2.9* 2.8*   Recent Labs  Lab 04/01/20 1923 04/02/20 0319 04/03/20 0642  LIPASE 1,575* 896* 81*   No results for input(s): AMMONIA in the last 168 hours. CBC: Recent Labs  Lab 04/01/20 1923 04/02/20 0319 04/03/20 0642 04/04/20 0639  WBC 11.8* 10.5 5.5 5.7  NEUTROABS 10.6* 8.9* 4.3  --   HGB 11.6* 11.1* 9.4* 10.1*  HCT 36.4 34.5* 29.4* 32.1*  MCV 68.0* 68.3* 68.2* 69.0*  PLT 242 225 195 218   Cardiac Enzymes: No results for input(s): CKTOTAL, CKMB, CKMBINDEX, TROPONINI in the last 168 hours. BNP: Invalid input(s): POCBNP CBG: Recent Labs  Lab 04/02/20 0959  GLUCAP 83   D-Dimer No results for input(s): DDIMER in the last 72 hours. Hgb A1c Recent Labs    04/03/20 0813  HGBA1C 5.4   Lipid Profile Recent Labs    04/03/20 0642 04/03/20 0813  CHOL   --  149  HDL  --  68  LDLCALC  --  72  TRIG 42 45  CHOLHDL  --  2.2   Thyroid  function studies No results for input(s): TSH, T4TOTAL, T3FREE, THYROIDAB in the last 72 hours.  Invalid input(s): FREET3 Anemia work up No results for input(s): VITAMINB12, FOLATE, FERRITIN, TIBC, IRON, RETICCTPCT in the last 72 hours. Urinalysis No results found for: COLORURINE, APPEARANCEUR, LABSPEC, PHURINE, GLUCOSEU, HGBUR, BILIRUBINUR, KETONESUR, PROTEINUR, UROBILINOGEN, NITRITE, LEUKOCYTESUR Sepsis Labs Invalid input(s): PROCALCITONIN,  WBC,  LACTICIDVEN Microbiology Recent Results (from the past 240 hour(s))  Respiratory Panel by RT PCR (Flu A&B, Covid) - Nasopharyngeal Swab     Status: None   Collection Time: 04/01/20 10:08 PM   Specimen: Nasopharyngeal Swab  Result Value Ref Range Status   SARS Coronavirus 2 by RT PCR NEGATIVE NEGATIVE Final    Comment: (NOTE) SARS-CoV-2 target nucleic acids are NOT DETECTED. The SARS-CoV-2 RNA is generally detectable in upper respiratoy specimens during the acute phase of infection. The lowest concentration of SARS-CoV-2 viral copies this assay can detect is 131 copies/mL. A negative result does not preclude SARS-Cov-2 infection and should not be used as the sole basis for treatment or other patient management decisions. A negative result may occur with  improper specimen collection/handling, submission of specimen other than nasopharyngeal swab, presence of viral mutation(s) within the areas targeted by this assay, and inadequate number of viral copies (<131 copies/mL). A negative result must be combined with clinical observations, patient history, and epidemiological information. The expected result is Negative. Fact Sheet for Patients:  https://www.moore.com/ Fact Sheet for Healthcare Providers:  https://www.young.biz/ This test is not yet ap proved or cleared by the [place] FDA and  has been authorized for  detection and/or diagnosis of SARS-CoV-2 by FDA under an Emergency Use Authorization (EUA). This EUA will remain  in effect (meaning this test can be used) for the duration of the COVID-19 declaration under Section 564(b)(1) of the Act, 21 U.S.C. section 360bbb-3(b)(1), unless the authorization is terminated or revoked sooner.    Influenza A by PCR NEGATIVE NEGATIVE Final   Influenza B by PCR NEGATIVE NEGATIVE Final    Comment: (NOTE) The Xpert Xpress SARS-CoV-2/FLU/RSV assay is intended as an aid in  the diagnosis of influenza from Nasopharyngeal swab specimens and  should not be used as a sole basis for treatment. Nasal washings and  aspirates are unacceptable for Xpert Xpress SARS-CoV-2/FLU/RSV  testing. Fact Sheet for Patients: https://www.moore.com/ Fact Sheet for Healthcare Providers: https://www.young.biz/ This test is not yet approved or cleared by the [place] FDA and  has been authorized for detection and/or diagnosis of SARS-CoV-2 by  FDA under an Emergency Use Authorization (EUA). This EUA will remain  in effect (meaning this test can be used) for the duration of the  Covid-19 declaration under Section 564(b)(1) of the Act, 21  U.S.C. section 360bbb-3(b)(1), unless the authorization is  terminated or revoked. Performed at [facility], [address] [place], [place] [address]   MRSA PCR Screening     Status: Abnormal   Collection Time: 04/02/20 12:13 PM   Specimen: Nasopharyngeal  Result Value Ref Range Status   MRSA by PCR POSITIVE (A) NEGATIVE Final    Comment:        The GeneXpert MRSA Assay (FDA approved for NASAL specimens only), is one component of a comprehensive MRSA colonization surveillance program. It is not intended to diagnose MRSA infection nor to guide or monitor treatment for MRSA infections. RESULT CALLED TO, READ BACK BY AND VERIFIED WITH: RN Antonieta Loveless 1557 04/02/20 KB       Time coordinating discharge:  I have spent 35 minutes face to face with the patient and on the ward discussing the patients care, assessment, plan and disposition with other care givers. >50% of the time was devoted counseling the patient about the risks and benefits of treatment/Discharge disposition and coordinating care.   SIGNED:   Dimple Nanas, MD  [facility] 04/05/2020, 11:46 AM   If 7PM-7AM, please contact night-coverage

## 2020-04-05 NOTE — Progress Notes (Signed)
Physical Therapy Treatment Patient Details Name: Meghan Kim MRN: [id number] DOB: [birth year] Today's Date: 04/05/2020    History of Present Illness Meghan Kim is a 86 y.o. female with medical history significant for hypertension, hypothyroidism, arthritis, and neuropathy, now presenting to the emergency department with acute onset of pain in her lower chest and upper abdomen along with nausea. Pt found to have acute cholecystitis and inflamed had of pancreas. Code stroke was then called as pt with slurred speech and word finding difficulty. IV tpa given on 5/9. Imaging negative for hemorrhage or acute infarct.    PT Comments    Pt tolerates treatment well, demonstrating improved activity tolerance. Pt ambulates for increased distances, although she continues to demonstrate slowed gait with shuffling steps, PT believes this may be near her functional baseline. PT provides education on increased forward trunk lean to improve transfer quality. Pt will benefit from initial 24/7 assistance at home to ensure a safe transition back to the home setting. Pt will also benefit from Home health PT to continue progressing upon activity tolerance and improving gait quality in hopes of reducing falls risk.   Follow Up Recommendations  Home health PT;Supervision/Assistance - 24 hour(24/7 initially)     Equipment Recommendations  None recommended by PT(pt owns necessary DME)    Recommendations for Other Services       Precautions / Restrictions Precautions Precautions: Fall Precaution Comments: HOH, bilat hearing aides Restrictions Weight Bearing Restrictions: No    Mobility  Bed Mobility               General bed mobility comments: pt up in chair upon arrival  Transfers Overall transfer level: Needs assistance Equipment used: Rolling walker (2 wheeled) Transfers: Sit to/from Stand Sit to Stand: Min guard         General transfer comment: pt performs 5 sit to stands  during session, initial attempt is labored but does not require physical assistance, other 4 with less effort. PT provides cues to increase forward trunk flexion  Ambulation/Gait Ambulation/Gait assistance: Min guard Gait Distance (Feet): 80 Feet Assistive device: Rolling walker (2 wheeled) Gait Pattern/deviations: Step-to pattern;Shuffle Gait velocity: slow Gait velocity interpretation: <1.8 ft/sec, indicate of risk for recurrent falls General Gait Details: pt with reduced foot clearance and step length bilaterally, slowed gait speed, no significant balance deviations noted   Stairs             Wheelchair Mobility    Modified Rankin (Stroke Patients Only) Modified Rankin (Stroke Patients Only) Pre-Morbid Rankin Score: Moderate disability Modified Rankin: Moderate disability     Balance Overall balance assessment: Needs assistance Sitting-balance support: No upper extremity supported;Feet supported Sitting balance-Leahy Scale: Good Sitting balance - Comments: supervision at edge of bed   Standing balance support: Bilateral upper extremity supported Standing balance-Leahy Scale: Fair Standing balance comment: minG-close supervision with BUE support of RW                            Cognition Arousal/Alertness: Awake/alert Behavior During Therapy: WFL for tasks assessed/performed Overall Cognitive Status: No family/caregiver present to determine baseline cognitive functioning                                 General Comments: follows commands well, pt appears to have mild short term memory deficits but PT feels this may be her baseline as 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       Pertinent Vitals/Pain Pain Assessment: No/denies pain    Home Living                       Prior Function            PT Goals (current goals can now be found in the care plan section) Acute Rehab PT Goals Patient Stated Goal: to go home Progress towards PT goals: Progressing toward goals    Frequency    Min 3X/week      PT Plan Current plan remains appropriate    Co-evaluation              AM-PAC PT "6 Clicks" Mobility   Outcome Measure  Help needed turning from your back to your side while in a flat bed without using bedrails?: A Little Help needed moving from lying on your back to sitting on the side of a flat bed without using bedrails?: A Little Help needed moving to and from a bed to a chair (including a wheelchair)?: A Little Help needed standing up from a chair using your arms (e.g., wheelchair or bedside chair)?: A Little Help needed to walk in hospital room?: A Little Help needed climbing 3-5 steps with a railing? : A Lot 6 Click Score: 17    End of Session   Activity Tolerance: Patient tolerated treatment well Patient left: in chair;with call bell/phone within reach Nurse Communication: Mobility status PT Visit Diagnosis: Unsteadiness on feet (R26.81);Difficulty in walking, not elsewhere classified (R26.2)     Time: 8413-2440 PT Time Calculation (min) (ACUTE ONLY): 23 min  Charges:  $Gait Training: 8-22 mins $Therapeutic Activity: 8-22 mins                     Zenaida Niece, PT, DPT Acute Rehabilitation Pager: [phone]    Zenaida Niece 04/05/2020, 9:34 AM

## 2020-05-09 ENCOUNTER — Encounter: Payer: Self-pay | Admitting: Neurology

## 2020-05-09 ENCOUNTER — Ambulatory Visit: Payer: Medicare Other | Admitting: Neurology

## 2020-05-09 VITALS — BP 156/74 | HR 71 | Ht 62.0 in | Wt 131.2 lb

## 2020-05-09 DIAGNOSIS — R2990 Unspecified symptoms and signs involving the nervous system: Secondary | ICD-10-CM

## 2020-05-09 DIAGNOSIS — G3184 Mild cognitive impairment, so stated: Secondary | ICD-10-CM | POA: Diagnosis not present

## 2020-05-09 DIAGNOSIS — R4701 Aphasia: Secondary | ICD-10-CM | POA: Diagnosis not present

## 2020-05-09 NOTE — Patient Instructions (Signed)
I had a long discussion with the patient and her caregiver regarding her recent episode of transient expressive aphasia was possibly a TIA and new complaints of worsening memory and cognitive issues likely representing age-appropriate mild cognitive impairment.  I recommend we check EEG as well as memory panel labs.  I encouraged her to increase participation in cognitively challenging activities like solving crossword puzzles, playing bridge and sodoku.  We also discussed memory compensation strategies.  I also recommend she increase her gabapentin to 300 mg twice daily to help with her paresthesias.  She will return for follow-up in the future in 3 months with my nurse practitioner Shanda Bumps or call earlier if necessary. Memory Compensation Strategies  1. Use "WARM" strategy.  W= write it down  A= associate it  R= repeat it  M= make a mental note  2.   You can keep a Glass blower/designer.  Use a 3-ring notebook with sections for the following: calendar, important names and phone numbers,  medications, doctors' names/phone numbers, lists/reminders, and a section to journal what you did  each day.   3.    Use a calendar to write appointments down.  4.    Write yourself a schedule for the day.  This can be placed on the calendar or in a separate section of the Memory Notebook.  Keeping a  regular schedule can help memory.  5.    Use medication organizer with sections for each day or morning/evening pills.  You may need help loading it  6.    Keep a basket, or pegboard by the door.  Place items that you need to take out with you in the basket or on the pegboard.  You may also want to  include a message board for reminders.  7.    Use sticky notes.  Place sticky notes with reminders in a place where the task is performed.  For example: " turn off the  stove" placed by the stove, "lock the door" placed on the door at eye level, " take your medications" on  the bathroom mirror or by the place where you  normally take your medications.  8.    Use alarms/timers.  Use while cooking to remind yourself to check on food or as a reminder to take your medicine, or as a  reminder to make a call, or as a reminder to perform another task, etc.

## 2020-05-09 NOTE — Progress Notes (Signed)
[facility] [address] [place]. [place] [address] [phone]       OFFICE FOLLOW-UP NOTE  [name] Date of Birth:  10-Jun-1926 Medical Record Number:  [id number]   HPI: [name] is a 86 year old pleasant Caucasian lady seen today for initial office follow-up visit following hospital consultation for code stroke.  History is obtained from the patient as well as review of electronic medical records and I personally reviewed imaging films in PACS.Meghan Kim is an 87 y.o. female with HTN, neuropathy and thyroid disease who initially presented yesterday evening with acute onset of pain in her lower chest and upper abdomen along with nausea. She was hypertensive and tachycardic in the ED with first degree AV nodal block. She was hyponatremic with Na of 123. AST, ALT and lipase were elevated and she had a leukocytosis of 18,800. CT abdomen revealed a distended gallbladder with pericholecystic inflammatory changes and significant intra and extrehepatic biliary ductal dilatation concerning for acute cholecystitis or choledocholithiasis. Inflammatory changes of the pancreatic head were also noted. GI was consulted for further evaluation of suspected acute cholecystitis/biliary pancreatitis/rule out choledocholithiasis. On 04/02/20 at 0730 she was noted to be normal. At 9:15 AM she was noted to have trouble getting her words out with word finding difficulty. She was clearly aware of the deficit and frustrated with it. She had no significant difficulty following commands. No focal motor or sensory deficit was noted by RN, except for possible mild right facial droop.   Her NIH stroke scale score was 3.  She was given IV TPA and admitted to the intensive care unit for blood pressure tightly controlled.  Her facial droop and speech improved quickly.  CT angiogram of the brain and neck showed no significant large vessel stenosis or occlusion.  MRI scan of the brain showed no  acute infarct.  Transthoracic echo showed normal ejection fraction.  LDL cholesterol was 72 mg percent.  Hemoglobin A1c was 5.4.  Patient has done well and is living at home and has a caregiver during the daytime.  She has chronic neuropathy and paresthesias in the feet.  She takes gabapentin 300 mg at night.  She feels her arthritis and neuropathy pain and paresthesias are getting worse.  She has not tried taking gabapentin during the day yet.  The patient and caregiver both state that for the last several weeks there has been significant decrease in memory as well as train of thought with she finding difficulty finding words at times searching in sentences.  She is able to ambulate with a walker and a cane and uses wheelchair for long distances.  She had hip surgery and since then her walking has gotten worse.  She has not been evaluated for memory loss yet.    ROS:   14 system review of systems is positive for decreased hearing, memory difficulties, confusion, speech difficulties, chest pain, arthritis all other systems negative  PMH:  Past Medical History:  Diagnosis Date  . Arthritis   . Hypertension   . Neuropathy   . Thyroid disease     Social History:  Social History   Socioeconomic History  . Marital status: Unknown    Spouse name: Not on file  . Number of children: Not on file  . Years of education: Not on file  . Highest education level: Not on file  Occupational History  . Not on file  Tobacco Use  . Smoking status: Former Games developer  . Smokeless tobacco: Never Used  Vaping Use  . Vaping Use: Never used  Substance and Sexual Activity  . Alcohol use: Yes    Comment: occ  . Drug use: Never  . Sexual activity: Not on file  Other Topics Concern  . Not on file  Social History Narrative  . Not on file   Social Determinants of Health   Financial Resource Strain:   . Difficulty of Paying Living Expenses:   Food Insecurity:   . Worried About Charity fundraiser in the  Last Year:   . Arboriculturist in the Last Year:   Transportation Needs:   . Film/video editor (Medical):   Marland Kitchen Lack of Transportation (Non-Medical):   Physical Activity:   . Days of Exercise per Week:   . Minutes of Exercise per Session:   Stress:   . Feeling of Stress :   Social Connections:   . Frequency of Communication with Friends and Family:   . Frequency of Social Gatherings with Friends and Family:   . Attends Religious Services:   . Active Member of Clubs or Organizations:   . Attends Archivist Meetings:   Marland Kitchen Marital Status:   Intimate Partner Violence:   . Fear of Current or Ex-Partner:   . Emotionally Abused:   Marland Kitchen Physically Abused:   . Sexually Abused:     Medications:   Current Outpatient Medications on File Prior to Visit  Medication Sig Dispense Refill  . acetaminophen (TYLENOL) 650 MG CR tablet Take 1,300 mg by mouth See admin instructions. Take 2 tablets (1300 mg) by mouth daily at bedtime, may also take 2 tablets (1300 mg) every 6 hours as needed for pain    . alendronate (FOSAMAX) 70 MG tablet Take 70 mg by mouth every Friday.     Marland Kitchen ammonium lactate (LAC-HYDRIN) 12 % lotion Apply 1 application topically 2 (two) times daily as needed (arm and hand itching).     Marland Kitchen aspirin 81 MG EC tablet Take by mouth.    Marland Kitchen aspirin EC 81 MG EC tablet Take 1 tablet (81 mg total) by mouth daily. 30 tablet 3  . clobetasol (TEMOVATE) 0.05 % external solution Apply 1 application topically 2 (two) times daily as needed (scalp itching).     Marland Kitchen docusate sodium (COLACE) 100 MG capsule Take 100 mg by mouth daily with breakfast.    . dupilumab (DUPIXENT) 300 MG/2ML prefilled syringe Inject 300 mg into the skin every 14 (fourteen) days. Every other Saturday    . dupilumab (DUPIXENT) 300 MG/2ML prefilled syringe Inject into the skin.    Marland Kitchen Fexofenadine HCl (WAL-FEX PO) Take 1 tablet by mouth daily with breakfast.    . fluticasone (FLONASE) 50 MCG/ACT nasal spray Place 2 sprays into  both nostrils 2 (two) times daily.     . furosemide (LASIX) 40 MG tablet Take 40 mg by mouth daily as needed (weight gain of 3 lbs or as directed by MD).    Marland Kitchen gabapentin (NEURONTIN) 300 MG capsule Take 300 mg by mouth in the morning and at bedtime.    Marland Kitchen GLUCOSAMINE-CHONDROITIN PO Take 1 tablet by mouth daily with breakfast.    . levothyroxine (SYNTHROID) 50 MCG tablet Take 50 mcg by mouth daily before breakfast.     . MAGNESIUM PO Take 1 tablet by mouth at bedtime.    . mirabegron ER (MYRBETRIQ) 25 MG TB24 tablet Take 25 mg by mouth daily with breakfast.     . mirabegron ER (MYRBETRIQ) 25  MG TB24 tablet Take by mouth.    . Multiple Vitamins-Minerals (MULTIVITAMIN GUMMIES WOMENS) CHEW Chew 2 tablets by mouth daily with breakfast.    . Omega-3 Fatty Acids (FISH OIL PO) Take 1 capsule by mouth daily with breakfast.    . oxyCODONE (OXY IR/ROXICODONE) 5 MG immediate release tablet Take 5 mg by mouth 2 (two) times daily as needed for severe pain.     . polyethylene glycol (MIRALAX / GLYCOLAX) 17 g packet Take 17 g by mouth daily with breakfast. Mix in 4-8 oz water and drink    . temazepam (RESTORIL) 15 MG capsule Take 15 mg by mouth at bedtime.     . valsartan (DIOVAN) 80 MG tablet Take by mouth.    . valsartan (DIOVAN) 80 MG tablet Take 80 mg by mouth daily with breakfast.      No current facility-administered medications on file prior to visit.    Allergies:   Allergies  Allergen Reactions  . Latex Itching and Rash  . Penicillins Swelling    "internal swelling"  . Tetanus Toxoid, Adsorbed Swelling  . Sulfamethoxazole-Trimethoprim Other (See Comments)    Unknown reaction   . Adhesive [Tape] Other (See Comments)    Causes skin tears  . Morphine Nausea Only    Physical Exam General: well developed, well nourished elderly Caucasian lady, seated, in no evident distress Head: head normocephalic and atraumatic.  Neck: supple with no carotid or supraclavicular bruits Cardiovascular: regular  rate and rhythm, no murmurs Musculoskeletal: no deformity Skin:  no rash/petichiae Vascular:  Normal pulses all extremities Vitals:   05/09/20 1400  BP: (!) 156/74  Pulse: 71   Neurologic Exam Mental Status: Awake and fully alert. Oriented to place and time. Recent and remote memory intact. Attention span, concentration and fund of knowledge appropriate. Mood and affect appropriate.  Diminished recall 2/3.  Able to name only 7 animals which can walk on 4 legs.  Clock drawing 4/4.  Slight difficulty in copying intersecting pentagons Cranial Nerves: Fundoscopic exam reveals sharp disc margins. Pupils equal, briskly reactive to light. Extraocular movements full without nystagmus. Visual fields full to confrontation. Hearing diminished bilaterally despite hearing aids. Facial sensation intact. Face, tongue, palate moves normally and symmetrically.  Motor: Normal bulk and tone. Normal strength in all tested extremity muscles. Sensory.: intact to touch ,pinprick .position and vibratory sensation.  Coordination: Rapid alternating movements normal in all extremities. Finger-to-nose and heel-to-shin performed accurately bilaterally. Gait and Station: Arises from chair without difficulty. Stance is normal. Gait demonstrates normal stride length and balance . Able to heel, toe and tandem walk with moderate t difficulty.  Reflexes: 1+ and symmetric. Toes downgoing.      ASSESSMENT: 86 year old Caucasian lady with transient episode of expressive aphasia in May 2011 possible TIA treated with IV TPA.  She also has memory and cognitive difficulties likely due to mild cognitive impairment.     PLAN: I had a long discussion with the patient and her caregiver regarding her recent episode of transient expressive aphasia was possibly a TIA and new complaints of worsening memory and cognitive issues likely representing age-appropriate mild cognitive impairment.  I recommend we check EEG as well as memory panel  labs.  I encouraged her to increase participation in cognitively challenging activities like solving crossword puzzles, playing bridge and sodoku.  We also discussed memory compensation strategies.  I also recommend she increase her gabapentin to 300 mg twice daily to help with her paresthesias.  She will return for follow-up  in the future in 3 months with my nurse practitioner Shanda Bumps or call earlier if necessary. Greater than 50% of time during this 30 minute visit was spent on counseling,explanation of diagnosis of aphasia, TIA and mild cognitive impairment, planning of further management, discussion with patient and family and coordination of care Delia Heady, MD  [facility] [address] [place], [place] [address]  Phone [phone] Fax [phone] Note: This document was prepared with digital dictation and possible smart phrase technology. Any transcriptional errors that result from this process are unintentional

## 2020-05-10 LAB — DEMENTIA PANEL
Homocysteine: 9.7 umol/L (ref 0.0–21.3)
RPR Ser Ql: NONREACTIVE
TSH: 3.46 u[IU]/mL (ref 0.450–4.500)
Vitamin B-12: 703 pg/mL (ref 232–1245)

## 2020-05-26 NOTE — Progress Notes (Signed)
Kindly inform the patient that lab work for reversible causes of memory loss was all normal

## 2020-06-07 ENCOUNTER — Ambulatory Visit: Payer: Medicare Other | Admitting: Neurology

## 2020-06-07 DIAGNOSIS — R4701 Aphasia: Secondary | ICD-10-CM

## 2020-06-07 DIAGNOSIS — G3184 Mild cognitive impairment, so stated: Secondary | ICD-10-CM

## 2020-06-08 ENCOUNTER — Telehealth: Payer: Self-pay | Admitting: Neurology

## 2020-06-08 NOTE — Telephone Encounter (Signed)
Pt states the results to her EEG are on My Chart but she would like a call re: what they mean, please call

## 2020-06-11 NOTE — Telephone Encounter (Signed)
Kindly inform patient that her EEG shows only age related slowing of brain activity. No seizure activity or any worrisome finding

## 2020-06-12 NOTE — Progress Notes (Signed)
Was slightKindly inform the patient that EEG study is abnormal due to mild slowing of brain activity which can be seen in a variety of conditions but in her case is likely due to her old age.  Nothing to worry about.

## 2020-06-12 NOTE — Telephone Encounter (Signed)
Called the patient and reviewed EEG results with her. Pt verbalized understanding. Pt had no questions at this time but was encouraged to call back if questions arise. Reminded the patient of apt scheduled apt in Sept.

## 2020-06-30 ENCOUNTER — Observation Stay (HOSPITAL_BASED_OUTPATIENT_CLINIC_OR_DEPARTMENT_OTHER)
Admission: EM | Admit: 2020-06-30 | Discharge: 2020-07-02 | Disposition: A | Discharge status: Home / Self Care | Payer: Medicare Other | Attending: Internal Medicine | Admitting: Internal Medicine

## 2020-06-30 ENCOUNTER — Other Ambulatory Visit: Payer: Self-pay

## 2020-06-30 ENCOUNTER — Emergency Department (HOSPITAL_BASED_OUTPATIENT_CLINIC_OR_DEPARTMENT_OTHER): Payer: Medicare Other

## 2020-06-30 ENCOUNTER — Emergency Department (HOSPITAL_COMMUNITY): Payer: Medicare Other

## 2020-06-30 ENCOUNTER — Encounter (HOSPITAL_BASED_OUTPATIENT_CLINIC_OR_DEPARTMENT_OTHER): Payer: Self-pay

## 2020-06-30 DIAGNOSIS — R748 Abnormal levels of other serum enzymes: Secondary | ICD-10-CM | POA: Insufficient documentation

## 2020-06-30 DIAGNOSIS — I10 Essential (primary) hypertension: Secondary | ICD-10-CM | POA: Diagnosis not present

## 2020-06-30 DIAGNOSIS — R519 Headache, unspecified: Secondary | ICD-10-CM | POA: Diagnosis not present

## 2020-06-30 DIAGNOSIS — Z79899 Other long term (current) drug therapy: Secondary | ICD-10-CM | POA: Diagnosis not present

## 2020-06-30 DIAGNOSIS — Z20822 Contact with and (suspected) exposure to covid-19: Secondary | ICD-10-CM | POA: Insufficient documentation

## 2020-06-30 DIAGNOSIS — H532 Diplopia: Secondary | ICD-10-CM | POA: Insufficient documentation

## 2020-06-30 DIAGNOSIS — Z91040 Latex allergy status: Secondary | ICD-10-CM | POA: Diagnosis not present

## 2020-06-30 DIAGNOSIS — G459 Transient cerebral ischemic attack, unspecified: Secondary | ICD-10-CM | POA: Diagnosis not present

## 2020-06-30 DIAGNOSIS — R7989 Other specified abnormal findings of blood chemistry: Secondary | ICD-10-CM

## 2020-06-30 DIAGNOSIS — Z7982 Long term (current) use of aspirin: Secondary | ICD-10-CM | POA: Insufficient documentation

## 2020-06-30 DIAGNOSIS — H53139 Sudden visual loss, unspecified eye: Secondary | ICD-10-CM | POA: Diagnosis present

## 2020-06-30 DIAGNOSIS — E871 Hypo-osmolality and hyponatremia: Secondary | ICD-10-CM | POA: Diagnosis not present

## 2020-06-30 DIAGNOSIS — R778 Other specified abnormalities of plasma proteins: Secondary | ICD-10-CM

## 2020-06-30 HISTORY — DX: Hypothyroidism, unspecified: E03.9

## 2020-06-30 LAB — CBC
HCT: 36.9 % (ref 36.0–46.0)
Hemoglobin: 11.6 g/dL — ABNORMAL LOW (ref 12.0–15.0)
MCH: 21.3 pg — ABNORMAL LOW (ref 26.0–34.0)
MCHC: 31.4 g/dL (ref 30.0–36.0)
MCV: 67.7 fL — ABNORMAL LOW (ref 80.0–100.0)
Platelets: 253 10*3/uL (ref 150–400)
RBC: 5.45 MIL/uL — ABNORMAL HIGH (ref 3.87–5.11)
RDW: 15.8 % — ABNORMAL HIGH (ref 11.5–15.5)
WBC: 5.6 10*3/uL (ref 4.0–10.5)
nRBC: 0 % (ref 0.0–0.2)

## 2020-06-30 LAB — URINALYSIS, MICROSCOPIC (REFLEX)

## 2020-06-30 LAB — URINALYSIS, ROUTINE W REFLEX MICROSCOPIC
Bilirubin Urine: NEGATIVE
Glucose, UA: NEGATIVE mg/dL
Hgb urine dipstick: NEGATIVE
Ketones, ur: NEGATIVE mg/dL
Nitrite: NEGATIVE
Protein, ur: NEGATIVE mg/dL
Specific Gravity, Urine: 1.015 (ref 1.005–1.030)
pH: 7.5 (ref 5.0–8.0)

## 2020-06-30 LAB — BASIC METABOLIC PANEL
Anion gap: 9 (ref 5–15)
BUN: 15 mg/dL (ref 8–23)
CO2: 24 mmol/L (ref 22–32)
Calcium: 9.3 mg/dL (ref 8.9–10.3)
Chloride: 92 mmol/L — ABNORMAL LOW (ref 98–111)
Creatinine, Ser: 0.78 mg/dL (ref 0.44–1.00)
GFR calc Af Amer: >60 mL/min (ref >60)
GFR calc non Af Amer: >60 mL/min (ref >60)
Glucose, Bld: 136 mg/dL — ABNORMAL HIGH (ref 70–99)
Potassium: 4.4 mmol/L (ref 3.5–5.1)
Sodium: 125 mmol/L — ABNORMAL LOW (ref 135–145)

## 2020-06-30 LAB — TROPONIN I (HIGH SENSITIVITY): Troponin I (High Sensitivity): 19 ng/L — ABNORMAL HIGH (ref <18)

## 2020-06-30 MED ORDER — ACETAMINOPHEN 325 MG PO TABS
650.0000 mg | ORAL_TABLET | Freq: Once | ORAL | Status: AC
  Administered 2020-07-01: 650 mg via ORAL
  Filled 2020-06-30: qty 2

## 2020-06-30 NOTE — ED Notes (Signed)
Patient transported to MRI 

## 2020-06-30 NOTE — ED Triage Notes (Signed)
bp ~160/70'S @ home, blurred/"split" vision & HA that began this afternoon, took ibuprofen without relief. A&O x4, lives at home alone, here with son

## 2020-06-30 NOTE — ED Provider Notes (Signed)
Patient arrives in transfer from med [facility].  She was sent here for evaluation of a possible TIA.  She had vision changes that have resolved.  She is sent for MRI. Physical Exam  BP (!) 151/80   Pulse 94   Temp 97.9 F (36.6 C)   Resp 20   Ht 5\' 2"  (1.575 m)   Wt 56.2 kg   SpO2 96%   BMI 22.68 kg/m   Patient is awake and alert.  She is resting comfortably in bed in no obvious distress.  Respirations are even and unlabored.  Her speech is not slurred.  She is able to lift both legs off the bed, 5/5 grip strength bilaterally.  Facial movements are symmetric.  Answers questions appropriately.  ED Course/Procedures     Procedures   CT Head Wo Contrast  Result Date: 06/30/2020 CLINICAL DATA:  Headache. EXAM: CT HEAD WITHOUT CONTRAST TECHNIQUE: Contiguous axial images were obtained from the base of the skull through the vertex without intravenous contrast. COMPARISON:  Apr 02, 2020 FINDINGS: Brain: There is moderate severity cerebral atrophy with widening of the extra-axial spaces and ventricular dilatation. There are areas of decreased attenuation within the white matter tracts of the supratentorial brain, consistent with microvascular disease changes Vascular: No hyperdense vessel or unexpected calcification. Skull: Normal. Negative for fracture or focal lesion. Sinuses/Orbits: No acute finding. Other: None. IMPRESSION: 1. Generalized cerebral atrophy. 2. No acute intracranial abnormality. Electronically Signed   By: Apr 04, 2020 M.D.   On: 06/30/2020 19:30   CT ANGIO NECK W OR WO CONTRAST  Result Date: 07/01/2020 CLINICAL DATA:  Focal neuro deficit, > 6 hrs, stroke suspected; Stroke/TIA, assess extracranial arteries EXAM: CT ANGIOGRAPHY HEAD AND NECK TECHNIQUE: Multidetector CT imaging of the head and neck was performed using the standard protocol during bolus administration of intravenous contrast. Multiplanar CT image reconstructions and MIPs were obtained to evaluate the  vascular anatomy. Carotid stenosis measurements (when applicable) are obtained utilizing NASCET criteria, using the distal internal carotid diameter as the denominator. CONTRAST:  08/31/2020 OMNIPAQUE IOHEXOL 350 MG/ML SOLN COMPARISON:  04/03/2020 FINDINGS: CTA NECK FINDINGS SKELETON: There is no bony spinal canal stenosis. No lytic or blastic lesion. OTHER NECK: Normal pharynx, larynx and major salivary glands. No cervical lymphadenopathy. Unremarkable thyroid gland. UPPER CHEST: No pneumothorax or pleural effusion. No nodules or masses. AORTIC ARCH: There is mild calcific atherosclerosis of the aortic arch. There is no aneurysm, dissection or hemodynamically significant stenosis of the visualized portion of the aorta. Conventional 3 vessel aortic branching pattern. The visualized proximal subclavian arteries are widely patent. RIGHT CAROTID SYSTEM: Normal without aneurysm, dissection or stenosis. LEFT CAROTID SYSTEM: Normal without aneurysm, dissection or stenosis. VERTEBRAL ARTERIES: Left dominant configuration. Both origins are clearly patent. There is no dissection, occlusion or flow-limiting stenosis to the skull base (V1-V3 segments). CTA HEAD FINDINGS POSTERIOR CIRCULATION: --Vertebral arteries: Normal V4 segments. --Inferior cerebellar arteries: Normal. --Basilar artery: Normal. --Superior cerebellar arteries: Normal. --Posterior cerebral arteries (PCA): Normal. ANTERIOR CIRCULATION: --Intracranial internal carotid arteries: Atherosclerotic calcification of the internal carotid arteries at the skull base without hemodynamically significant stenosis. --Anterior cerebral arteries (ACA): Normal. Both A1 segments are present. Patent anterior communicating artery (a-comm). --Middle cerebral arteries (MCA): Normal. VENOUS SINUSES: As permitted by contrast timing, patent. ANATOMIC VARIANTS: Fetal origin of the left posterior cerebral artery. Diminutive right P-comm. Review of the MIP images confirms the above findings.  IMPRESSION: 1. No emergent large vessel occlusion or high-grade stenosis of the intracranial or cervical  arteries. 2. Aortic Atherosclerosis (ICD10-I70.0). Electronically Signed   By: Deatra Robinson M.D.   On: 07/01/2020 02:01   MR BRAIN WO CONTRAST  Result Date: 07/01/2020 CLINICAL DATA:  TIA EXAM: MRI HEAD WITHOUT CONTRAST TECHNIQUE: Multiplanar, multiecho pulse sequences of the brain and surrounding structures were obtained without intravenous contrast. COMPARISON:  Head CT 06/30/2020 Brain MRI 04/03/2020 FINDINGS: Brain: No acute infarct, acute hemorrhage or extra-axial collection. Diffuse confluent hyperintense T2-weighted signal within the periventricular, deep and juxtacortical white matter, most commonly due to chronic ischemic microangiopathy. There is generalized atrophy without lobar predilection. No chronic microhemorrhage. Normal midline structures. Vascular: Normal flow voids. Skull and upper cervical spine: Normal marrow signal. Sinuses/Orbits: Small amount of bilateral mastoid fluid. Orbits are normal. Other: None. IMPRESSION: 1. No acute intracranial abnormality. 2. Advanced chronic ischemic microangiopathy and generalized atrophy without lobar predilection. Electronically Signed   By: Deatra Robinson M.D.   On: 07/01/2020 00:32   CT ANGIO HEAD CODE STROKE  Result Date: 07/01/2020 CLINICAL DATA:  Focal neuro deficit, > 6 hrs, stroke suspected; Stroke/TIA, assess extracranial arteries EXAM: CT ANGIOGRAPHY HEAD AND NECK TECHNIQUE: Multidetector CT imaging of the head and neck was performed using the standard protocol during bolus administration of intravenous contrast. Multiplanar CT image reconstructions and MIPs were obtained to evaluate the vascular anatomy. Carotid stenosis measurements (when applicable) are obtained utilizing NASCET criteria, using the distal internal carotid diameter as the denominator. CONTRAST:  OMNIPAQUE IOHEXOL 350 MG/ML SOLN COMPARISON:  04/03/2020 FINDINGS: CTA  NECK FINDINGS SKELETON: There is no bony spinal canal stenosis. No lytic or blastic lesion. OTHER NECK: Normal pharynx, larynx and major salivary glands. No cervical lymphadenopathy. Unremarkable thyroid gland. UPPER CHEST: No pneumothorax or pleural effusion. No nodules or masses. AORTIC ARCH: There is mild calcific atherosclerosis of the aortic arch. There is no aneurysm, dissection or hemodynamically significant stenosis of the visualized portion of the aorta. Conventional 3 vessel aortic branching pattern. The visualized proximal subclavian arteries are widely patent. RIGHT CAROTID SYSTEM: Normal without aneurysm, dissection or stenosis. LEFT CAROTID SYSTEM: Normal without aneurysm, dissection or stenosis. VERTEBRAL ARTERIES: Left dominant configuration. Both origins are clearly patent. There is no dissection, occlusion or flow-limiting stenosis to the skull base (V1-V3 segments). CTA HEAD FINDINGS POSTERIOR CIRCULATION: --Vertebral arteries: Normal V4 segments. --Inferior cerebellar arteries: Normal. --Basilar artery: Normal. --Superior cerebellar arteries: Normal. --Posterior cerebral arteries (PCA): Normal. ANTERIOR CIRCULATION: --Intracranial internal carotid arteries: Atherosclerotic calcification of the internal carotid arteries at the skull base without hemodynamically significant stenosis. --Anterior cerebral arteries (ACA): Normal. Both A1 segments are present. Patent anterior communicating artery (a-comm). --Middle cerebral arteries (MCA): Normal. VENOUS SINUSES: As permitted by contrast timing, patent. ANATOMIC VARIANTS: Fetal origin of the left posterior cerebral artery. Diminutive right P-comm. Review of the MIP images confirms the above findings. IMPRESSION: 1. No emergent large vessel occlusion or high-grade stenosis of the intracranial or cervical arteries. 2. Aortic Atherosclerosis (ICD10-I70.0). Electronically Signed   By: Deatra Robinson M.D.   On: 07/01/2020 02:01     MDM  Plan is to  follow-up on MRI.  She reports that she still has a headache and wants something for her headache at this time.  She denies any implants or embedded metal.  Has had MRI before without difficulty.  Will order PO tylenol for her headache, RN asked to perform stroke swallow screen prior to giving tylenol.   I spoke with neurology.   [id number] I spoke with Hospitalist.  I had spoke with Dr. Laurence Slate who  requests admission for Long term cardiac monitoring, TIA evaluation, correction of hyponatremia and HTN.         Copper, [place], PA-C 07/01/20 0617    Zadie Rhine, MD 07/01/20 [phone]

## 2020-06-30 NOTE — ED Provider Notes (Signed)
[facility] EMERGENCY DEPARTMENT Provider Note   CSN: [id number] Arrival date & time: 06/30/20  1741     History No chief complaint on file.   Candis Musalizabeth Bogucki is a 86 y.o. female.  Patient is a 86 year old female with a history of hypertension, neuropathy and thyroid disease who is presenting today with symptoms of vision loss.  Patient reports about 3:00 this afternoon she was looking at the TV when suddenly she was only able to see half of her vision.  This lasted for approximately 10 to 20 minutes and then has resolved.  Since that time she has had a headache which has improved with ibuprofen.  She is not had any aphasia, new unilateral weakness or numbness.  She has recently had adjustment in her blood pressure medication but denies any recent illness or other changes.  Patient reports she did have a significant work-up to see if she had a TIA in May of this year after she had been admitted for transaminitis and pancreatitis and had an episode.  She had a negative MRI at that time and CTA negative for LVO.  She reports they were never able to tell her whether it was a stroke or not.  The history is provided by the patient and a relative.       Past Medical History:  Diagnosis Date  . Arthritis   . Hypertension   . Neuropathy   . Thyroid disease     Patient Active Problem List   Diagnosis Date Noted  . Thyroid disease   . Hypertension   . Chronic hyponatremia   . Acute pancreatitis   . Transaminitis   . Acute cholecystitis 04/01/2020    Past Surgical History:  Procedure Laterality Date  . arm surgery    . HIP SURGERY    . SHOULDER SURGERY    . WRIST SURGERY       OB History   No obstetric history on file.     History reviewed. No pertinent family history.  Social History   Tobacco Use  . Smoking status: Former Games developermoker  . Smokeless tobacco: Never Used  Vaping Use  . Vaping Use: Never used  Substance Use Topics  . Alcohol use: Yes    Comment:  occ  . Drug use: Never    Home Medications Prior to Admission medications   Medication Sig Start Date End Date Taking? Authorizing Provider  acetaminophen (TYLENOL) 650 MG CR tablet Take 1,300 mg by mouth See admin instructions. Take 2 tablets (1300 mg) by mouth daily at bedtime, may also take 2 tablets (1300 mg) every 6 hours as needed for pain    [provider]  alendronate (FOSAMAX) 70 MG tablet Take 70 mg by mouth every Friday.  01/12/20   [provider]  ammonium lactate (LAC-HYDRIN) 12 % lotion Apply 1 application topically 2 (two) times daily as needed (arm and hand itching).  12/14/19   [provider]  aspirin 81 MG EC tablet Take by mouth. 04/06/20   [provider]  aspirin EC 81 MG EC tablet Take 1 tablet (81 mg total) by mouth daily. 04/06/20   Amin, Loura HaltAnkit Chirag, MD  clobetasol (TEMOVATE) 0.05 % external solution Apply 1 application topically 2 (two) times daily as needed (scalp itching).  10/14/19   [provider]  docusate sodium (COLACE) 100 MG capsule Take 100 mg by mouth daily with breakfast.    [provider]  dupilumab (DUPIXENT) 300 MG/2ML prefilled syringe Inject  300 mg into the skin every 14 (fourteen) days. Every other Saturday 02/05/20   [provider]  dupilumab (DUPIXENT) 300 MG/2ML prefilled syringe Inject into the skin. 02/05/20   [provider]  Fexofenadine HCl (WAL-FEX PO) Take 1 tablet by mouth daily with breakfast.    [provider]  fluticasone (FLONASE) 50 MCG/ACT nasal spray Place 2 sprays into both nostrils 2 (two) times daily.  12/15/19   [provider]  furosemide (LASIX) 40 MG tablet Take 40 mg by mouth daily as needed (weight gain of 3 lbs or as directed by MD).    [provider]  gabapentin (NEURONTIN) 300 MG capsule Take 300 mg by mouth in the morning and at bedtime. 02/08/20 05/09/20  [provider]  GLUCOSAMINE-CHONDROITIN PO Take 1 tablet by  mouth daily with breakfast.    [provider]  levothyroxine (SYNTHROID) 50 MCG tablet Take 50 mcg by mouth daily before breakfast.  07/08/17   [provider]  MAGNESIUM PO Take 1 tablet by mouth at bedtime.    [provider]  mirabegron ER (MYRBETRIQ) 25 MG TB24 tablet Take 25 mg by mouth daily with breakfast.  03/20/20   [provider]  mirabegron ER (MYRBETRIQ) 25 MG TB24 tablet Take by mouth. 04/26/20   [provider]  Multiple Vitamins-Minerals (MULTIVITAMIN GUMMIES WOMENS) CHEW Chew 2 tablets by mouth daily with breakfast.    [provider]  Omega-3 Fatty Acids (FISH OIL PO) Take 1 capsule by mouth daily with breakfast.    [provider]  oxyCODONE (OXY IR/ROXICODONE) 5 MG immediate release tablet Take 5 mg by mouth 2 (two) times daily as needed for severe pain.  02/12/20   [provider]  polyethylene glycol (MIRALAX / GLYCOLAX) 17 g packet Take 17 g by mouth daily with breakfast. Mix in 4-8 oz water and drink    [provider]  temazepam (RESTORIL) 15 MG capsule Take 15 mg by mouth at bedtime.  11/11/19   [provider]  valsartan (DIOVAN) 80 MG tablet Take 80 mg by mouth daily with breakfast.  03/20/20   [provider]  valsartan (DIOVAN) 80 MG tablet Take by mouth. 05/09/20   [provider]    Allergies    Latex; Penicillins; Tetanus toxoid, adsorbed; Sulfamethoxazole-trimethoprim; Adhesive [tape]; and Morphine  Review of Systems   Review of Systems  All other systems reviewed and are negative.   Physical Exam Updated Vital Signs BP (!) 181/76   Pulse 88   Temp 98 F (36.7 C) (Oral)   Resp 17   Ht 5\' 2"  (1.575 m)   Wt 56.2 kg   SpO2 100%   BMI 22.68 kg/m   Physical Exam Vitals and nursing note reviewed.  Constitutional:      General: She is not in acute distress.    Appearance: Normal appearance. She is well-developed and normal weight.  HENT:     Head:  Normocephalic and atraumatic.     Right Ear: Tympanic membrane normal.     Left Ear: Tympanic membrane normal.     Mouth/Throat:     Mouth: Mucous membranes are moist.  Eyes:     Pupils: Pupils are equal, round, and reactive to light.  Cardiovascular:     Rate and Rhythm: Normal rate and regular rhythm.     Pulses: Normal pulses.     Heart sounds: Normal heart sounds. No murmur heard.  No friction rub.  Pulmonary:  Effort: Pulmonary effort is normal.     Breath sounds: Normal breath sounds. No wheezing or rales.  Abdominal:     General: Bowel sounds are normal. There is no distension.     Palpations: Abdomen is soft.     Tenderness: There is no abdominal tenderness. There is no guarding or rebound.  Musculoskeletal:        General: No tenderness. Normal range of motion.     Cervical back: Normal range of motion and neck supple. No tenderness.     Comments: No edema  Skin:    General: Skin is warm and dry.     Findings: No rash.  Neurological:     Mental Status: She is alert and oriented to person, place, and time. Mental status is at baseline.     Cranial Nerves: No cranial nerve deficit.     Comments: 3 out of 5 strength in bilateral upper extremities with sensation intact.  4 out of 5 strength in bilateral lower extremities with sensation intact.  No facial droop noted.  No visual field cuts or pronator drift noted.  Speech without aphasia  Psychiatric:        Mood and Affect: Mood normal.        Behavior: Behavior normal.        Thought Content: Thought content normal.      ED Results / Procedures / Treatments   Labs (all labs ordered are listed, but only abnormal results are displayed) Labs Reviewed  CBC - Abnormal; Notable for the following components:      Result Value   RBC 5.45 (*)    Hemoglobin 11.6 (*)    MCV 67.7 (*)    MCH 21.3 (*)    RDW 15.8 (*)    All other components within normal limits  BASIC METABOLIC PANEL - Abnormal; Notable for the following  components:   Sodium 125 (*)    Chloride 92 (*)    Glucose, Bld 136 (*)    All other components within normal limits  URINALYSIS, ROUTINE W REFLEX MICROSCOPIC - Abnormal; Notable for the following components:   APPearance HAZY (*)    Leukocytes,Ua SMALL (*)    All other components within normal limits  URINALYSIS, MICROSCOPIC (REFLEX) - Abnormal; Notable for the following components:   Bacteria, UA FEW (*)    All other components within normal limits  TROPONIN I (HIGH SENSITIVITY) - Abnormal; Notable for the following components:   Troponin I (High Sensitivity) 19 (*)    All other components within normal limits    EKG EKG Interpretation  Date/Time:  Friday June 30 2020 17:55:50 EDT Ventricular Rate:  89 PR Interval:  284 QRS Duration: 74 QT Interval:  346 QTC Calculation: 420 R Axis:   -54 Text Interpretation: Sinus rhythm with 1st degree A-V block Left anterior fascicular block Minimal voltage criteria for LVH, may be normal variant ( R in aVL ) Septal infarct , age undetermined Possible Lateral infarct , age undetermined No significant change since last tracing Confirmed by Gwyneth Sprout ([id number]) on 06/30/2020 7:01:56 PM   Radiology CT Head Wo Contrast  Result Date: 06/30/2020 CLINICAL DATA:  Headache. EXAM: CT HEAD WITHOUT CONTRAST TECHNIQUE: Contiguous axial images were obtained from the base of the skull through the vertex without intravenous contrast. COMPARISON:  Apr 02, 2020 FINDINGS: Brain: There is moderate severity cerebral atrophy with widening of the extra-axial spaces and ventricular dilatation. There are areas of decreased attenuation within the white matter tracts  of the supratentorial brain, consistent with microvascular disease changes Vascular: No hyperdense vessel or unexpected calcification. Skull: Normal. Negative for fracture or focal lesion. Sinuses/Orbits: No acute finding. Other: None. IMPRESSION: 1. Generalized cerebral atrophy. 2. No acute intracranial  abnormality. Electronically Signed   By: Aram Candela M.D.   On: 06/30/2020 19:30    Procedures Procedures (including critical care time)  Medications Ordered in ED Medications - No data to display  ED Course  I have reviewed the triage vital signs and the nursing notes.  Pertinent labs & imaging results that were available during my care of the patient were reviewed by me and considered in my medical decision making (see chart for details).    MDM Rules/Calculators/A&P                          Elderly female presenting today with complaint of sudden vision loss in half of her vision.  She reports the right side of her vision was just gone for 10 to 20 minutes.  She has had a headache since that time but no return of symptoms.  Patient is hypertensive here 181/76 but otherwise reassuring vital signs.  Patient had recently had adjustment in her blood pressure medication and they were mildly increased.  Otherwise she has not had any recent illness.  She denies any dizziness, nausea vomiting or other neurologic complaints.  She does have chronic neuropathy and weakness in her upper extremities.  EKG was unchanged from prior, CBC without acute findings, BMP with recurrent hyponatremia with a sodium of 125.  Her sodium has been between 123 and 131 in the last few months.  May be related to recent medication adjustment.  Troponin today is 19 yet patient denies any shortness of breath or chest pain.  Given patient's complaint concern for TIA.  She has no eye pain or vision loss at this time.  She had a CTA of her head and neck done 3 months ago that showed moderate right ICA stenosis and mild left ICA stenosis but no significant stenosis of intracranial arteries.  Feel that patient will need an MRI of her brain however if that is negative she may be able to get a neurology consult and potentially go home.  Discussed the case with Dr. Particia Nearing who has accepted her at [facility] for further  imaging.  MDM Number of Diagnoses or Management Options   Amount and/or Complexity of Data Reviewed Clinical lab tests: ordered and reviewed Tests in the radiology section of CPT: ordered and reviewed Tests in the medicine section of CPT: ordered and reviewed Decide to obtain previous medical records or to obtain history from someone other than the patient: yes Obtain history from someone other than the patient: yes Review and summarize past medical records: yes Discuss the patient with other providers: yes Independent visualization of images, tracings, or specimens: yes  Risk of Complications, Morbidity, and/or Mortality Presenting problems: high Diagnostic procedures: low Management options: low  Patient Progress Patient progress: stable  Final Clinical Impression(s) / ED Diagnoses Final diagnoses:  TIA (transient ischemic attack)    Rx / DC Orders ED Discharge Orders    None       Gwyneth Sprout, MD 06/30/20 2100

## 2020-06-30 NOTE — ED Notes (Addendum)
Patient from [facility] for MRI.  NAD.  VSS.  No problems en route from [facility].

## 2020-07-01 ENCOUNTER — Encounter (HOSPITAL_COMMUNITY): Payer: Self-pay | Admitting: Family Medicine

## 2020-07-01 ENCOUNTER — Observation Stay (HOSPITAL_COMMUNITY): Payer: Medicare Other

## 2020-07-01 ENCOUNTER — Emergency Department (HOSPITAL_COMMUNITY): Payer: Medicare Other

## 2020-07-01 DIAGNOSIS — G459 Transient cerebral ischemic attack, unspecified: Secondary | ICD-10-CM

## 2020-07-01 DIAGNOSIS — G43109 Migraine with aura, not intractable, without status migrainosus: Secondary | ICD-10-CM

## 2020-07-01 DIAGNOSIS — R778 Other specified abnormalities of plasma proteins: Secondary | ICD-10-CM

## 2020-07-01 DIAGNOSIS — E871 Hypo-osmolality and hyponatremia: Secondary | ICD-10-CM

## 2020-07-01 DIAGNOSIS — I10 Essential (primary) hypertension: Secondary | ICD-10-CM

## 2020-07-01 LAB — COMPREHENSIVE METABOLIC PANEL
ALT: 15 U/L (ref 0–44)
AST: 21 U/L (ref 15–41)
Albumin: 3.4 g/dL — ABNORMAL LOW (ref 3.5–5.0)
Alkaline Phosphatase: 62 U/L (ref 38–126)
Anion gap: 10 (ref 5–15)
BUN: 8 mg/dL (ref 8–23)
CO2: 21 mmol/L — ABNORMAL LOW (ref 22–32)
Calcium: 8.8 mg/dL — ABNORMAL LOW (ref 8.9–10.3)
Chloride: 96 mmol/L — ABNORMAL LOW (ref 98–111)
Creatinine, Ser: 0.45 mg/dL (ref 0.44–1.00)
GFR calc Af Amer: >60 mL/min (ref >60)
GFR calc non Af Amer: >60 mL/min (ref >60)
Glucose, Bld: 95 mg/dL (ref 70–99)
Potassium: 3.8 mmol/L (ref 3.5–5.1)
Sodium: 127 mmol/L — ABNORMAL LOW (ref 135–145)
Total Bilirubin: 1 mg/dL (ref 0.3–1.2)
Total Protein: 6.1 g/dL — ABNORMAL LOW (ref 6.5–8.1)

## 2020-07-01 LAB — LIPID PANEL
Cholesterol: 168 mg/dL (ref 0–200)
HDL: 73 mg/dL (ref >40)
LDL Cholesterol: 87 mg/dL (ref 0–99)
Total CHOL/HDL Ratio: 2.3 RATIO
Triglycerides: 41 mg/dL (ref <150)
VLDL: 8 mg/dL (ref 0–40)

## 2020-07-01 LAB — SARS CORONAVIRUS 2 BY RT PCR (HOSPITAL ORDER, PERFORMED IN ~~LOC~~ HOSPITAL LAB): SARS Coronavirus 2: NEGATIVE

## 2020-07-01 LAB — TROPONIN I (HIGH SENSITIVITY): Troponin I (High Sensitivity): 42 ng/L — ABNORMAL HIGH (ref <18)

## 2020-07-01 LAB — C-REACTIVE PROTEIN: CRP: <0.5 mg/dL (ref <1.0)

## 2020-07-01 LAB — SEDIMENTATION RATE: Sed Rate: 6 mm/hr (ref 0–22)

## 2020-07-01 MED ORDER — ACETAMINOPHEN 325 MG PO TABS
650.0000 mg | ORAL_TABLET | ORAL | Status: DC | PRN
  Administered 2020-07-01: 650 mg via ORAL
  Filled 2020-07-01: qty 2

## 2020-07-01 MED ORDER — IOHEXOL 350 MG/ML SOLN
100.0000 mL | Freq: Once | INTRAVENOUS | Status: AC | PRN
  Administered 2020-07-01: 100 mL via INTRAVENOUS

## 2020-07-01 MED ORDER — DOCUSATE SODIUM 100 MG PO CAPS
100.0000 mg | ORAL_CAPSULE | Freq: Every day | ORAL | Status: DC
  Administered 2020-07-01 – 2020-07-02 (×2): 100 mg via ORAL
  Filled 2020-07-01 (×2): qty 1

## 2020-07-01 MED ORDER — ASPIRIN EC 81 MG PO TBEC
81.0000 mg | DELAYED_RELEASE_TABLET | Freq: Every day | ORAL | Status: DC
  Administered 2020-07-01 – 2020-07-02 (×2): 81 mg via ORAL
  Filled 2020-07-01 (×2): qty 1

## 2020-07-01 MED ORDER — ATORVASTATIN CALCIUM 10 MG PO TABS
20.0000 mg | ORAL_TABLET | Freq: Every day | ORAL | Status: DC
  Administered 2020-07-01 – 2020-07-02 (×2): 20 mg via ORAL
  Filled 2020-07-01 (×2): qty 2

## 2020-07-01 MED ORDER — TEMAZEPAM 15 MG PO CAPS
15.0000 mg | ORAL_CAPSULE | Freq: Every day | ORAL | Status: DC
  Administered 2020-07-01: 15 mg via ORAL
  Filled 2020-07-01: qty 1

## 2020-07-01 MED ORDER — OXYCODONE HCL 5 MG PO TABS: 5.0000 mg | ORAL_TABLET | Freq: Two times a day (BID) | ORAL | Status: DC | PRN

## 2020-07-01 MED ORDER — ACETAMINOPHEN 160 MG/5ML PO SOLN: 650.0000 mg | ORAL | Status: DC | PRN

## 2020-07-01 MED ORDER — LEVOTHYROXINE SODIUM 50 MCG PO TABS
50.0000 ug | ORAL_TABLET | Freq: Every day | ORAL | Status: DC
  Administered 2020-07-01 – 2020-07-02 (×2): 50 ug via ORAL
  Filled 2020-07-01 (×3): qty 1

## 2020-07-01 MED ORDER — STROKE: EARLY STAGES OF RECOVERY BOOK
Freq: Once | Status: AC
  Filled 2020-07-01: qty 1

## 2020-07-01 MED ORDER — MIRABEGRON ER 25 MG PO TB24
25.0000 mg | ORAL_TABLET | Freq: Every day | ORAL | Status: DC
  Administered 2020-07-01 – 2020-07-02 (×2): 25 mg via ORAL
  Filled 2020-07-01 (×3): qty 1

## 2020-07-01 MED ORDER — IRBESARTAN 75 MG PO TABS
75.0000 mg | ORAL_TABLET | Freq: Every day | ORAL | Status: DC
  Administered 2020-07-02: 75 mg via ORAL
  Filled 2020-07-01 (×2): qty 1

## 2020-07-01 MED ORDER — ACETAMINOPHEN 650 MG RE SUPP: 650.0000 mg | RECTAL | Status: DC | PRN

## 2020-07-01 MED ORDER — SODIUM CHLORIDE 0.9 % IV SOLN: INTRAVENOUS | Status: DC

## 2020-07-01 NOTE — ED Notes (Signed)
Pt refused to have blood draw

## 2020-07-01 NOTE — H&P (Signed)
History and Physical    Meghan Kim QIO:[id number] DOB: 31-Dec-1925 DOA: 06/30/2020  PCP: [facility], [facility]   Patient coming from: Home  Chief Complaint: double vision, elevated BP  HPI: Meghan Kim is a 86 y.o. female with medical history significant for HTN, hypothyroidism, TIA, peripheral neuropathy, OA who presents from [facility] for MRI and stroke workup. She reports was at home and home health PT came to work with her and noticed she had elevated SBP to over 160. Her PCP was called who advised her to go to ER to be seen. Pt was evaluated at [facility] and all of her symptoms had resolved. She was sent to [facility] ER for MRI brain which was done and showed no acute stroke. She had CTA head and neck performed which showed no large vessel occlusion.  She states that she did not have any change in her speech, difficulty swallowing, drooping face, numbness or weakness of extremities.  She did not have any syncope or dizziness.  She states she did not have any ataxia or incoordination.  She states she noticed her vision became double which lasted for 3 to 4 minutes and then resolved spontaneously.  She lives alone.  She does not drink alcohol or use illicit drugs.  She reports a history of smoking but quit over 25 years ago. States that she is compliant with her medications ED Course:   In the emergency room she was seen by neurology who felt that she may be having episodes of paroxysmal A. fib as the etiology.  She did have TIA a few months ago with work-up that was negative.  She had echocardiogram in May of this year. It was not done during previous TIA work-up was cardiac monitoring with a loop monitor or Holter monitor.  Neurology recommends patient get set up for a loop monitor to evaluate for paroxysmal A. Fib.  Initial troponin was negative but repeat troponin was mildly elevated.  Patient has no chest pain, pressure or palpitations.  Review of Systems:  General: Denies weakness, fever,  chills, weight loss, night sweats.  Denies dizziness.  Denies change in appetite HENT: Denies head trauma, headache, denies change in hearing, tinnitus.  Denies nasal congestion or bleeding.  Denies sore throat, sores in mouth.  Denies difficulty swallowing Eyes: Denies pain in eye, drainage.  Denies discoloration of eyes. Neck: Denies pain.  Denies swelling.  Denies pain with movement. Cardiovascular: Denies chest pain, palpitations.  Denies edema.  Denies orthopnea Respiratory: Denies shortness of breath, cough.  Denies wheezing.  Denies sputum production Gastrointestinal: Denies abdominal pain, swelling.  Denies nausea, vomiting, diarrhea.  Denies melena.  Denies hematemesis. Musculoskeletal: Denies limitation of movement.  Denies deformity or swelling.  Denies pain.  Denies arthralgias or myalgias. Genitourinary: Denies pelvic pain.  Denies urinary frequency or hesitancy.  Denies dysuria.  Skin: Denies rash.  Denies petechiae, purpura, ecchymosis. Neurological: Denies headache.  Denies syncope.  Denies seizure activity.  Denies weakness or paresthesia.  Denies slurred speech, drooping face.  Denies visual change. Psychiatric: Denies depression, anxiety.  Denies suicidal thoughts or ideation.  Denies hallucinations.  Past Medical History:  Diagnosis Date  . Arthritis   . Hypertension   . Hypothyroidism   . Neuropathy   . Thyroid disease     Past Surgical History:  Procedure Laterality Date  . arm surgery    . HIP SURGERY    . SHOULDER SURGERY    . WRIST SURGERY      Social History  reports that she has quit smoking. She has never used smokeless tobacco. She reports current alcohol use. She reports that she does not use drugs.  Allergies  Allergen Reactions  . Latex Itching and Rash  . Penicillins Swelling    "internal swelling"  . Tetanus Toxoid, Adsorbed Swelling  . Sulfamethoxazole-Trimethoprim Other (See Comments)    Unknown reaction   . Adhesive [Tape] Other (See  Comments)    Causes skin tears  . Morphine Nausea Only    History reviewed. No pertinent family history.   Prior to Admission medications   Medication Sig Start Date End Date Taking? Authorizing Provider  acetaminophen (TYLENOL) 650 MG CR tablet Take 1,300 mg by mouth See admin instructions. Take 2 tablets (1300 mg) by mouth daily at bedtime, may also take 2 tablets (1300 mg) every 6 hours as needed for pain    [provider]  alendronate (FOSAMAX) 70 MG tablet Take 70 mg by mouth every Friday.  01/12/20   [provider]  ammonium lactate (LAC-HYDRIN) 12 % lotion Apply 1 application topically 2 (two) times daily as needed (arm and hand itching).  12/14/19   [provider]  aspirin 81 MG EC tablet Take by mouth. 04/06/20   [provider]  aspirin EC 81 MG EC tablet Take 1 tablet (81 mg total) by mouth daily. 04/06/20   Amin, Loura Halt, MD  clobetasol (TEMOVATE) 0.05 % external solution Apply 1 application topically 2 (two) times daily as needed (scalp itching).  10/14/19   [provider]  docusate sodium (COLACE) 100 MG capsule Take 100 mg by mouth daily with breakfast.    [provider]  dupilumab (DUPIXENT) 300 MG/2ML prefilled syringe Inject 300 mg into the skin every 14 (fourteen) days. Every other Saturday 02/05/20   [provider]  dupilumab (DUPIXENT) 300 MG/2ML prefilled syringe Inject into the skin. 02/05/20   [provider]  Fexofenadine HCl (WAL-FEX PO) Take 1 tablet by mouth daily with breakfast.    [provider]  fluticasone (FLONASE) 50 MCG/ACT nasal spray Place 2 sprays into both nostrils 2 (two) times daily.  12/15/19   [provider]  furosemide (LASIX) 40 MG tablet Take 40 mg by mouth daily as needed (weight gain of 3 lbs or as directed by MD).    [provider]  gabapentin (NEURONTIN) 300 MG capsule Take 300 mg by mouth in the morning and at bedtime. 02/08/20 05/09/20   [provider]  GLUCOSAMINE-CHONDROITIN PO Take 1 tablet by mouth daily with breakfast.    [provider]  levothyroxine (SYNTHROID) 50 MCG tablet Take 50 mcg by mouth daily before breakfast.  07/08/17   [provider]  MAGNESIUM PO Take 1 tablet by mouth at bedtime.    [provider]  mirabegron ER (MYRBETRIQ) 25 MG TB24 tablet Take 25 mg by mouth daily with breakfast.  03/20/20   [provider]  mirabegron ER (MYRBETRIQ) 25 MG TB24 tablet Take by mouth. 04/26/20   [provider]  Multiple Vitamins-Minerals (MULTIVITAMIN GUMMIES WOMENS) CHEW Chew 2 tablets by mouth daily with breakfast.    [provider]  Omega-3 Fatty Acids (FISH OIL PO) Take 1 capsule by mouth daily with breakfast.    [provider]  oxyCODONE (OXY IR/ROXICODONE) 5 MG immediate release tablet Take 5 mg by mouth 2 (two) times daily as needed for severe pain.  02/12/20   [provider]  polyethylene glycol (MIRALAX / GLYCOLAX) 17 g  packet Take 17 g by mouth daily with breakfast. Mix in 4-8 oz water and drink    [provider]  temazepam (RESTORIL) 15 MG capsule Take 15 mg by mouth at bedtime.  11/11/19   [provider]  valsartan (DIOVAN) 80 MG tablet Take 80 mg by mouth daily with breakfast.  03/20/20   [provider]  valsartan (DIOVAN) 80 MG tablet Take by mouth. 05/09/20   [provider]    Physical Exam: Vitals:   06/30/20 2055 06/30/20 2112 06/30/20 2130 06/30/20 2238  BP: (!) 193/103 (!) 184/77 (!) 170/74 (!) 151/80  Pulse: (!) 104 89 90 94  Resp: 20 20 (!) 21 20  Temp:    97.9 F (36.6 C)  TempSrc:      SpO2: 100% 100% 98% 96%  Weight:      Height:        Constitutional: NAD, calm, comfortable Vitals:   06/30/20 2055 06/30/20 2112 06/30/20 2130 06/30/20 2238  BP: (!) 193/103 (!) 184/77 (!) 170/74 (!) 151/80  Pulse: (!) 104 89 90 94  Resp: 20 20 (!) 21 20  Temp:    97.9 F (36.6 C)    TempSrc:      SpO2: 100% 100% 98% 96%  Weight:      Height:       General: WDWN, Alert and oriented x3.  Eyes: EOMI, PERRL, lids and conjunctivae normal.  Sclera nonicteric HENT:  [place]/AT, external ears normal.  Nares patent without epistasis.  Mucous membranes are moist. Posterior pharynx clear of any exudate or lesions. Normal dentition.  Neck: Soft, normal range of motion, supple, no masses, no thyromegaly.  Trachea midline Respiratory: clear to auscultation bilaterally, no wheezing, no crackles. Normal respiratory effort. No accessory muscle use.  Cardiovascular: Regular rate and rhythm, no murmurs / rubs / gallops. No extremity edema. 2+ pedal pulses. No carotid bruits.  Abdomen: Soft, no tenderness, nondistended, no rebound or guarding.  No masses palpated. No hepatosplenomegaly. Bowel sounds normoactive Musculoskeletal: FROM. no clubbing / cyanosis. No joint deformity upper and lower extremities. no contractures. Normal muscle tone.  Skin: Warm, dry, intact no rashes, lesions, ulcers. No induration Neurologic: CN 2-12 grossly intact.  Normal speech.  Sensation intact, patella DTR +1 bilaterally. Strength 5/5 in all extremities. No drift Psychiatric: Normal judgment and insight.  Normal mood.    Labs on Admission: I have personally reviewed following labs and imaging studies  CBC: Recent Labs  Lab 06/30/20 1806  WBC 5.6  HGB 11.6*  HCT 36.9  MCV 67.7*  PLT 253    Basic Metabolic Panel: Recent Labs  Lab 06/30/20 1806  NA 125*  K 4.4  CL 92*  CO2 24  GLUCOSE 136*  BUN 15  CREATININE 0.78  CALCIUM 9.3    GFR: Estimated Creatinine Clearance: 36.2 mL/min (by C-G formula based on SCr of 0.78 mg/dL).  Liver Function Tests: No results for input(s): AST, ALT, ALKPHOS, BILITOT, PROT, ALBUMIN in the last 168 hours.  Urine analysis:    Component Value Date/Time   COLORURINE YELLOW 06/30/2020 1908   APPEARANCEUR HAZY (A) 06/30/2020 1908   LABSPEC 1.015 06/30/2020 1908    PHURINE 7.5 06/30/2020 1908   GLUCOSEU NEGATIVE 06/30/2020 1908   HGBUR NEGATIVE 06/30/2020 1908   BILIRUBINUR NEGATIVE 06/30/2020 1908   KETONESUR NEGATIVE 06/30/2020 1908   PROTEINUR NEGATIVE 06/30/2020 1908   NITRITE NEGATIVE 06/30/2020 1908   LEUKOCYTESUR SMALL (A) 06/30/2020 1908    Radiological Exams on Admission: CT  Head Wo Contrast  Result Date: 06/30/2020 CLINICAL DATA:  Headache. EXAM: CT HEAD WITHOUT CONTRAST TECHNIQUE: Contiguous axial images were obtained from the base of the skull through the vertex without intravenous contrast. COMPARISON:  Apr 02, 2020 FINDINGS: Brain: There is moderate severity cerebral atrophy with widening of the extra-axial spaces and ventricular dilatation. There are areas of decreased attenuation within the white matter tracts of the supratentorial brain, consistent with microvascular disease changes Vascular: No hyperdense vessel or unexpected calcification. Skull: Normal. Negative for fracture or focal lesion. Sinuses/Orbits: No acute finding. Other: None. IMPRESSION: 1. Generalized cerebral atrophy. 2. No acute intracranial abnormality. Electronically Signed   By: Aram Candela M.D.   On: 06/30/2020 19:30   CT ANGIO NECK W OR WO CONTRAST  Result Date: 07/01/2020 CLINICAL DATA:  Focal neuro deficit, > 6 hrs, stroke suspected; Stroke/TIA, assess extracranial arteries EXAM: CT ANGIOGRAPHY HEAD AND NECK TECHNIQUE: Multidetector CT imaging of the head and neck was performed using the standard protocol during bolus administration of intravenous contrast. Multiplanar CT image reconstructions and MIPs were obtained to evaluate the vascular anatomy. Carotid stenosis measurements (when applicable) are obtained utilizing NASCET criteria, using the distal internal carotid diameter as the denominator. CONTRAST:  OMNIPAQUE IOHEXOL 350 MG/ML SOLN COMPARISON:  04/03/2020 FINDINGS: CTA NECK FINDINGS SKELETON: There is no bony spinal canal stenosis. No lytic or  blastic lesion. OTHER NECK: Normal pharynx, larynx and major salivary glands. No cervical lymphadenopathy. Unremarkable thyroid gland. UPPER CHEST: No pneumothorax or pleural effusion. No nodules or masses. AORTIC ARCH: There is mild calcific atherosclerosis of the aortic arch. There is no aneurysm, dissection or hemodynamically significant stenosis of the visualized portion of the aorta. Conventional 3 vessel aortic branching pattern. The visualized proximal subclavian arteries are widely patent. RIGHT CAROTID SYSTEM: Normal without aneurysm, dissection or stenosis. LEFT CAROTID SYSTEM: Normal without aneurysm, dissection or stenosis. VERTEBRAL ARTERIES: Left dominant configuration. Both origins are clearly patent. There is no dissection, occlusion or flow-limiting stenosis to the skull base (V1-V3 segments). CTA HEAD FINDINGS POSTERIOR CIRCULATION: --Vertebral arteries: Normal V4 segments. --Inferior cerebellar arteries: Normal. --Basilar artery: Normal. --Superior cerebellar arteries: Normal. --Posterior cerebral arteries (PCA): Normal. ANTERIOR CIRCULATION: --Intracranial internal carotid arteries: Atherosclerotic calcification of the internal carotid arteries at the skull base without hemodynamically significant stenosis. --Anterior cerebral arteries (ACA): Normal. Both A1 segments are present. Patent anterior communicating artery (a-comm). --Middle cerebral arteries (MCA): Normal. VENOUS SINUSES: As permitted by contrast timing, patent. ANATOMIC VARIANTS: Fetal origin of the left posterior cerebral artery. Diminutive right P-comm. Review of the MIP images confirms the above findings. IMPRESSION: 1. No emergent large vessel occlusion or high-grade stenosis of the intracranial or cervical arteries. 2. Aortic Atherosclerosis (ICD10-I70.0). Electronically Signed   By: Deatra Robinson M.D.   On: 07/01/2020 02:01   MR BRAIN WO CONTRAST  Result Date: 07/01/2020 CLINICAL DATA:  TIA EXAM: MRI HEAD WITHOUT CONTRAST  TECHNIQUE: Multiplanar, multiecho pulse sequences of the brain and surrounding structures were obtained without intravenous contrast. COMPARISON:  Head CT 06/30/2020 Brain MRI 04/03/2020 FINDINGS: Brain: No acute infarct, acute hemorrhage or extra-axial collection. Diffuse confluent hyperintense T2-weighted signal within the periventricular, deep and juxtacortical white matter, most commonly due to chronic ischemic microangiopathy. There is generalized atrophy without lobar predilection. No chronic microhemorrhage. Normal midline structures. Vascular: Normal flow voids. Skull and upper cervical spine: Normal marrow signal. Sinuses/Orbits: Small amount of bilateral mastoid fluid. Orbits are normal. Other: None. IMPRESSION: 1. No acute intracranial abnormality. 2. Advanced chronic ischemic microangiopathy and  generalized atrophy without lobar predilection. Electronically Signed   By: Deatra RobinsonKevin  Herman M.D.   On: 07/01/2020 00:32   CT ANGIO HEAD CODE STROKE  Result Date: 07/01/2020 CLINICAL DATA:  Focal neuro deficit, > 6 hrs, stroke suspected; Stroke/TIA, assess extracranial arteries EXAM: CT ANGIOGRAPHY HEAD AND NECK TECHNIQUE: Multidetector CT imaging of the head and neck was performed using the standard protocol during bolus administration of intravenous contrast. Multiplanar CT image reconstructions and MIPs were obtained to evaluate the vascular anatomy. Carotid stenosis measurements (when applicable) are obtained utilizing NASCET criteria, using the distal internal carotid diameter as the denominator. CONTRAST:  100mL OMNIPAQUE IOHEXOL 350 MG/ML SOLN COMPARISON:  04/03/2020 FINDINGS: CTA NECK FINDINGS SKELETON: There is no bony spinal canal stenosis. No lytic or blastic lesion. OTHER NECK: Normal pharynx, larynx and major salivary glands. No cervical lymphadenopathy. Unremarkable thyroid gland. UPPER CHEST: No pneumothorax or pleural effusion. No nodules or masses. AORTIC ARCH: There is mild calcific  atherosclerosis of the aortic arch. There is no aneurysm, dissection or hemodynamically significant stenosis of the visualized portion of the aorta. Conventional 3 vessel aortic branching pattern. The visualized proximal subclavian arteries are widely patent. RIGHT CAROTID SYSTEM: Normal without aneurysm, dissection or stenosis. LEFT CAROTID SYSTEM: Normal without aneurysm, dissection or stenosis. VERTEBRAL ARTERIES: Left dominant configuration. Both origins are clearly patent. There is no dissection, occlusion or flow-limiting stenosis to the skull base (V1-V3 segments). CTA HEAD FINDINGS POSTERIOR CIRCULATION: --Vertebral arteries: Normal V4 segments. --Inferior cerebellar arteries: Normal. --Basilar artery: Normal. --Superior cerebellar arteries: Normal. --Posterior cerebral arteries (PCA): Normal. ANTERIOR CIRCULATION: --Intracranial internal carotid arteries: Atherosclerotic calcification of the internal carotid arteries at the skull base without hemodynamically significant stenosis. --Anterior cerebral arteries (ACA): Normal. Both A1 segments are present. Patent anterior communicating artery (a-comm). --Middle cerebral arteries (MCA): Normal. VENOUS SINUSES: As permitted by contrast timing, patent. ANATOMIC VARIANTS: Fetal origin of the left posterior cerebral artery. Diminutive right P-comm. Review of the MIP images confirms the above findings. IMPRESSION: 1. No emergent large vessel occlusion or high-grade stenosis of the intracranial or cervical arteries. 2. Aortic Atherosclerosis (ICD10-I70.0). Electronically Signed   By: Deatra RobinsonKevin  Herman M.D.   On: 07/01/2020 02:01    EKG: Independently reviewed.  Normal sinus rhythm with first-degree AV block.  Nonspecific ST change but no acute ST elevation or depression.  Assessment/Plan Principal Problem:   TIA (transient ischemic attack) Patient be observed on medical telemetry floor for TIA.  Case discussed with on-call neurology.  Neurology feels that patient  may be having paroxysmal A. fib as etiology of TIAs and feels a loop recorder needs to be arranged.  This to be arranged by the cardiology PA in place as an outpatient as I do not believe they will do this as an inpatient.  Patient will be given her blood pressure medication in the morning and work to getting her down to goal blood pressure per recommendation from neurology. Antiplatelet therapy with aspirin daily. Neurochecks per stroke protocol Active Problems:   Hypertension Patient is on Diovan at home which will be converted to Avapro which is on formulary while in the hospital.  Monitor blood pressure    Hyponatremia Gentle IV fluid with normal saline at 75 mils per hour overnight. Recheck electrolytes in the morning    Elevated troponin level Check serial troponin level to make sure no ischemic event was etiology of symptoms    DVT prophylaxis: SCDs for DVT prophylaxis Code Status:   Full code Family Communication:  Diagnosis plan discussed  with patient.  Patient verbalized understanding agrees with plan.  Further limited follow as clinical indicated Disposition Plan:   Patient is from:  Home  Anticipated DC to:  Home  Anticipated DC date:  Anticipate discharge in less than 2 midnights  Anticipated DC barriers: No barriers to discharge identified at this time  Consults called:  Neurology Admission status:  Observation  Severity of Illness: The appropriate patient status for this patient is OBSERVATION. Observation status is judged to be reasonable and necessary in order to provide the required intensity of service to ensure the patient's safety. The patient's presenting symptoms, physical exam findings, and initial radiographic and laboratory data in the context of their medical condition is felt to place them at decreased risk for further clinical deterioration. Furthermore, it is anticipated that the patient will be medically stable for discharge from the hospital within 2  midnights of admission. The following factors support the patient status of observation.   " The patient's presenting symptoms include transient blurry vision. " The physical exam is normal. " The initial radiographic and laboratory data are significant for hyponatremia.      Claudean Severance Khadim Lundberg MD [facility]  How to contact the [facility] Attending or Consulting provider 7A - 7P or covering provider during after hours 7P -7A, for this patient?   1. Check the care team in [facility] and look for a) attending/consulting [facility] provider listed and b) the [facility] team listed 2. Log into [email or web address] and use [facility]'s universal password to access. If you do not have the password, please contact the hospital operator. 3. Locate the [facility] provider you are looking for under [facility] and page to a number that you can be directly reached. 4. If you still have difficulty reaching the provider, please page the [facility] (Director on Call) for the Hospitalists listed on amion for assistance.  07/01/2020, 4:00 AM

## 2020-07-01 NOTE — Progress Notes (Signed)
STROKE TEAM PROGRESS NOTE   INTERVAL HISTORY Her EEG tech is at the bedside in performing EEG.  Patient awake alert, interactive.  Headache resolved last night.  She recounted HPI with me.  Patient admitted in 03/2020 for aphasia status post TPA MRI negative.  No headache at that time.  Put on aspirin 81.  This time she had a acute right hemianopia lasting 10 to 50 minutes followed by headache.  MRI negative.  Sodium 125, this morning 127.  EEG pending.  She stated that she had just 1 headache years ago, no history of migraine.  OBJECTIVE Vitals:   06/30/20 2130 06/30/20 2238 07/01/20 0527 07/01/20 1052  BP: (!) 170/74 (!) 151/80 (!) 149/82 (!) 103/56  Pulse: 90 94 88 84  Resp: (!) 21 20 19 18   Temp:  97.9 F (36.6 C) 97.9 F (36.6 C) 98.6 F (37 C)  TempSrc:      SpO2: 98% 96% 98% 96%  Weight:      Height:        CBC:  Recent Labs  Lab 06/30/20 1806  WBC 5.6  HGB 11.6*  HCT 36.9  MCV 67.7*  PLT 253    Basic Metabolic Panel:  Recent Labs  Lab 06/30/20 1806 07/01/20 0945  NA 125* 127*  K 4.4 3.8  CL 92* 96*  CO2 24 21*  GLUCOSE 136* 95  BUN 15 8  CREATININE 0.78 0.45  CALCIUM 9.3 8.8*    Lipid Panel:     Component Value Date/Time   CHOL 168 07/01/2020 0630   TRIG 41 07/01/2020 0630   HDL 73 07/01/2020 0630   CHOLHDL 2.3 07/01/2020 0630   VLDL 8 07/01/2020 0630   LDLCALC 87 07/01/2020 0630   HgbA1c:  Lab Results  Component Value Date   HGBA1C 5.4 04/03/2020   Urine Drug Screen: No results found for: LABOPIA, COCAINSCRNUR, LABBENZ, AMPHETMU, THCU, LABBARB  Alcohol Level No results found for: ETH  IMAGING  CT Head Wo Contrast 06/30/2020  IMPRESSION:  1. Generalized cerebral atrophy.  2. No acute intracranial abnormality.   CT ANGIO HEAD CODE STROKE CT ANGIO NECK W OR WO CONTRAST 07/01/2020 IMPRESSION:  1. No emergent large vessel occlusion or high-grade stenosis of the intracranial or cervical arteries.   2. Aortic Atherosclerosis (ICD10-I70.0).    MR BRAIN WO CONTRAST 07/01/2020 IMPRESSION:  1. No acute intracranial abnormality.  2. Advanced chronic ischemic microangiopathy and generalized atrophy without lobar predilection.   Transthoracic Echocardiogram  00/00/2021 IMPRESSIONS  1. Left ventricular ejection fraction, by estimation, is 65 to 70%. The  left ventricle has normal function. The left ventricle has no regional  wall motion abnormalities. There is moderate left ventricular hypertrophy.  Left ventricular diastolic  parameters are indeterminate.  2. Right ventricular systolic function is normal. The right ventricular  size is normal. Tricuspid regurgitation signal is inadequate for assessing  PA pressure.  3. The mitral valve is normal in structure. No evidence of mitral valve  regurgitation.  4. The aortic valve is tricuspid. Aortic valve regurgitation is not  visualized. Mild aortic valve sclerosis is present, with no evidence of  aortic valve stenosis.   ECG - SR rate 89 BPM. (See cardiology reading for complete details)  EEG This normal EEG is recorded in the waking and sleep state. There was no seizure or seizure predisposition recorded on this study. Please note that lack of epileptiform activity on EEG does not preclude the possibility of epilepsy.   PHYSICAL EXAM  Temp:  [  97.3 F (36.3 C)-98.6 F (37 C)] 98.3 F (36.8 C) (08/07 1639) Pulse Rate:  [70-104] 70 (08/07 1639) Resp:  [16-21] 16 (08/07 1639) BP: (103-193)/(56-103) 185/71 (08/07 1639) SpO2:  [96 %-100 %] 99 % (08/07 1639)  General - Well nourished, well developed, in no apparent distress.  Ophthalmologic - fundi not visualized due to noncooperation.  Cardiovascular - Regular rhythm and rate.  Mental Status -  Level of arousal and orientation to time, place, and person were intact. Language including expression, naming, repetition, comprehension was assessed and found intact.  Cranial Nerves II - XII - II - Visual field intact  OU. III, IV, VI - Extraocular movements intact. V - Facial sensation intact bilaterally. VII - Facial movement intact bilaterally. VIII - Hearing & vestibular intact bilaterally. X - Palate elevates symmetrically. XI - Chin turning & shoulder shrug intact bilaterally. XII - Tongue protrusion intact.  Motor Strength - The patient's strength was normal in all extremities and pronator drift was absent.  Bulk was normal and fasciculations were absent.   Motor Tone - Muscle tone was assessed at the neck and appendages and was normal.  Reflexes - The patient's reflexes were symmetrical in all extremities and she had no pathological reflexes  Sensory - Light touch, temperature/pinprick were assessed and were symmetrical.    Coordination - The patient had normal movements in the hands with no ataxia or dysmetria.  Tremor was absent.  Gait and Station - deferred.   ASSESSMENT/PLAN [name] is a 86 y.o. female with history of hypertension, hypothyroidism, neuropathy, TIA, bilateral distal ICA stenosis, and on 04/02/20 she experienced a strokelike episode characterized by expressive aphasia and treated with tPA  while admitted for abdominal pain due to cholecystitis. Yesterday she presented with a 10 to 15 minute transient episode of  "split vision" associated with a headache. She did not receive IV t-PA due to resolution of deficits.   Likely complicated migraine - DDx also include TIA, less likely seizure  Transient right hemianopia followed by headache, however no history of migraine.  CT head - Generalized cerebral atrophy. No acute intracranial abnormality.   MRI head - No acute intracranial abnormality. Advanced chronic ischemic microangiopathy and generalized atrophy without lobar predilection.   MRA head - not ordered  CTA H&N -  No emergent large vessel occlusion or high-grade stenosis of the intracranial or cervical arteries.    EEG normal EEG, no seizure  2D Echo -  EF 65 to 70%. No cardiac source of emboli identified.   Sars Corona Virus 2 - negative  LDL - 87  HgbA1c - 5.4  UDS - not ordered  VTE prophylaxis - SCDs  aspirin 81 mg daily prior to admission, now on aspirin 81 mg daily.  Continue aspirin on discharge  Patient counseled to be compliant with her antithrombotic medications  Ongoing aggressive stroke risk factor management  Therapy recommendations:  pending  Disposition:  Pending  Stroke-like symptoms in 03/2020 - ? Migraine equivalent vs. TIA  03/2020 admitted for aphasia.  Status post TPA.  CT showed no acute abnormality, chronic left thalamic and cerebellar lacunar infarcts.  CT head and neck bilateral siphon stenosis, right more than left, MRI negative for stroke.  EF 65 to 70%.  LDL 72 and A1c 5.4.  Put on aspirin.  No statin due to elevated LFTs.  Follow with Dr. Pearlean Brownie at [facility].  EEG negative for seizure.  Hypertension  Home BP meds: Norvasc ; Diovan  Current BP meds:  Irbesartan  Stable . Long-term BP goal normotensive  Hyperlipidemia  Home Lipid lowering medication: none   LDL 87, goal < 70  Current lipid lowering medication: Lipitor 20  Continue statin at discharge  Other Stroke Risk Factors  Advanced age  Former cigarette smoker - quit  ETOH use, advised to drink no more than 1 alcoholic beverage per day.  Other Active Problems  Code status - Full code   Aortic Atherosclerosis (ICD10-I70.0).   Hyponatremia, likely chronic- Na - 125->127 - EEG neg for seizure  Hospital day # 0  Neurology will sign off. Please call with questions. Pt will follow up with stroke clinic on 08/23/20 at [facility]. Thanks for the consult.  Marvel Plan, MD PhD Stroke Neurology 07/01/2020 6:17 PM  To contact Stroke Continuity provider, please refer to [email or web address]. After hours, contact General Neurology

## 2020-07-01 NOTE — Progress Notes (Signed)
EEG complete - results pending 

## 2020-07-01 NOTE — ED Notes (Signed)
Pt assisted to RR and returned to recliner. Pt denies pain. All needs addressed at this time. PT resting comfortably. Tele monitor applied

## 2020-07-01 NOTE — Evaluation (Signed)
PT Cancellation Note  Patient Details Name: Meghan Kim MRN: [id number] DOB: 1926-06-18   Cancelled Treatment:    Reason Eval/Treat Not Completed: Patient declined, no reason specified. Patient refused and stated she "did not want to pay for physical therapy." PT will hold at this time, but suspect she will continue to refuse services. Per chart review, pt was recieving HH PT and may be interested in continuing that service.   Elisha Ponder, SPT, ATC

## 2020-07-01 NOTE — Progress Notes (Signed)
OT Cancellation Note  Patient Details Name: Meghan Kim MRN: [id number] DOB: 04-25-1926   Cancelled Treatment:    Reason Eval/Treat Not Completed: Other (comment) (Pt adamantly refusing therapy stating she does not want to pay for it. OT will hold at this time, but suspect pt will continue to refuse services. Of note per chart reviewmm, pt was recieving HHtherapy and perhaps is interested in continuing that service)  Dalphine Handing, MSOT, OTR/L Acute Rehabilitation Services [facility] Office Number: [phone] Pager: [phone]  Dalphine Handing 07/01/2020, 10:38 AM

## 2020-07-01 NOTE — Consult Note (Signed)
Requesting Physician: Lyndel SafeElizabeth Hammond    Chief Complaint:   History obtained from: Patient and Chart    HPI:                                                                                                                                       Meghan Kim is a 86 y.o. female with past medical history of hypertension, hypothyroidism, neuropathy, TIA, bilateral distal ICA stenosis presents to the emergency department at [facility] with episode concerning for TIA.  Patient states around 2 PM in the afternoon while she was watching TV she suddenly noticed she had " split" vision.  She covered one eye and she could could not see the right side of the TV and she cover the other eye and noticed the same.  The entire episode lasted about 10 to 15 minutes and her symptoms completely resolved.  Shortly after patient's been having headache.  [facility] to obtain MRI brain which was negative.  Neurology was consulted for further recommendations.  Chart review Received IV TPA on 5/921 after having strokelike episode characterized by expressive aphasia while admitted for abdominal pain due to cholecystitis Work-up included CTA which showed bilateral calcified carotid siphon plaque and moderate to mild stenosis.  MRI showed age-related small vessel disease.  LDL was 72.  Echo showed EF of 60 to 65%.  A1c 5.4.  Patient discharged on aspirin 81 mg.   Past Medical History:  Diagnosis Date  . Arthritis   . Hypertension   . Hypothyroidism   . Neuropathy   . Thyroid disease     Past Surgical History:  Procedure Laterality Date  . arm surgery    . HIP SURGERY    . SHOULDER SURGERY    . WRIST SURGERY      History reviewed. No pertinent family history. Social History:  reports that she has quit smoking. She has never used smokeless tobacco. She reports current alcohol use. She reports that she does not use drugs.  Allergies:  Allergies  Allergen Reactions  .  Latex Itching and Rash  . Penicillins Swelling    "internal swelling"  . Tetanus Toxoid, Adsorbed Swelling  . Sulfamethoxazole-Trimethoprim Other (See Comments)    Unknown reaction   . Adhesive [Tape] Other (See Comments)    Causes skin tears  . Morphine Nausea Only    Medications:  I reviewed home medications   ROS:                                                                                                                                     14 systems reviewed and negative except above    Examination:                                                                                                      General: Appears well-developed and well-nourished.  Psych: Affect appropriate to situation Eyes: No scleral injection HENT: No OP obstrucion Head: Normocephalic.  Cardiovascular: Normal rate and regular rhythm.  Respiratory: Effort normal and breath sounds normal to anterior ascultation GI: Soft.  No distension. There is no tenderness.  Skin: WDI    Neurological Examination Mental Status: Alert, oriented, thought content appropriate.  Speech fluent without evidence of aphasia. Able to follow 3 step commands without difficulty. Cranial Nerves: II: Visual fields grossly normal,  III,IV, VI: ptosis not present, extra-ocular motions intact bilaterally, pupils equal, round, reactive to light and accommodation V,VII: smile symmetric, facial light touch sensation normal bilaterally VIII: hearing normal bilaterally IX,X: uvula rises symmetrically XI: bilateral shoulder shrug XII: midline tongue extension Motor: Right : Upper extremity   4/5    Left:     Upper extremity   4/5  Lower extremity   4/5     Lower extremity   4/5 Tone and bulk:normal tone throughout; no atrophy noted Sensory: Pinprick and light touch intact throughout,  bilaterally Plantars: Right: downgoing   Left: downgoing Cerebellar: No gross ataxia      Lab Results: Basic Metabolic Panel: Recent Labs  Lab 06/30/20 1806  NA 125*  K 4.4  CL 92*  CO2 24  GLUCOSE 136*  BUN 15  CREATININE 0.78  CALCIUM 9.3    CBC: Recent Labs  Lab 06/30/20 1806  WBC 5.6  HGB 11.6*  HCT 36.9  MCV 67.7*  PLT 253    Coagulation Studies: No results for input(s): LABPROT, INR in the last 72 hours.  Imaging: CT Head Wo Contrast  Result Date: 06/30/2020 CLINICAL DATA:  Headache. EXAM: CT HEAD WITHOUT CONTRAST TECHNIQUE: Contiguous axial images were obtained from the base of the skull through the vertex without intravenous contrast. COMPARISON:  Apr 02, 2020 FINDINGS: Brain: There is moderate severity cerebral atrophy with widening of the extra-axial spaces and ventricular dilatation. There are areas of decreased attenuation within the white matter tracts of the supratentorial brain, consistent with  microvascular disease changes Vascular: No hyperdense vessel or unexpected calcification. Skull: Normal. Negative for fracture or focal lesion. Sinuses/Orbits: No acute finding. Other: None. IMPRESSION: 1. Generalized cerebral atrophy. 2. No acute intracranial abnormality. Electronically Signed   By: Aram Candela M.D.   On: 06/30/2020 19:30   CT ANGIO NECK W OR WO CONTRAST  Result Date: 07/01/2020 CLINICAL DATA:  Focal neuro deficit, > 6 hrs, stroke suspected; Stroke/TIA, assess extracranial arteries EXAM: CT ANGIOGRAPHY HEAD AND NECK TECHNIQUE: Multidetector CT imaging of the head and neck was performed using the standard protocol during bolus administration of intravenous contrast. Multiplanar CT image reconstructions and MIPs were obtained to evaluate the vascular anatomy. Carotid stenosis measurements (when applicable) are obtained utilizing NASCET criteria, using the distal internal carotid diameter as the denominator. CONTRAST:  OMNIPAQUE IOHEXOL 350  MG/ML SOLN COMPARISON:  04/03/2020 FINDINGS: CTA NECK FINDINGS SKELETON: There is no bony spinal canal stenosis. No lytic or blastic lesion. OTHER NECK: Normal pharynx, larynx and major salivary glands. No cervical lymphadenopathy. Unremarkable thyroid gland. UPPER CHEST: No pneumothorax or pleural effusion. No nodules or masses. AORTIC ARCH: There is mild calcific atherosclerosis of the aortic arch. There is no aneurysm, dissection or hemodynamically significant stenosis of the visualized portion of the aorta. Conventional 3 vessel aortic branching pattern. The visualized proximal subclavian arteries are widely patent. RIGHT CAROTID SYSTEM: Normal without aneurysm, dissection or stenosis. LEFT CAROTID SYSTEM: Normal without aneurysm, dissection or stenosis. VERTEBRAL ARTERIES: Left dominant configuration. Both origins are clearly patent. There is no dissection, occlusion or flow-limiting stenosis to the skull base (V1-V3 segments). CTA HEAD FINDINGS POSTERIOR CIRCULATION: --Vertebral arteries: Normal V4 segments. --Inferior cerebellar arteries: Normal. --Basilar artery: Normal. --Superior cerebellar arteries: Normal. --Posterior cerebral arteries (PCA): Normal. ANTERIOR CIRCULATION: --Intracranial internal carotid arteries: Atherosclerotic calcification of the internal carotid arteries at the skull base without hemodynamically significant stenosis. --Anterior cerebral arteries (ACA): Normal. Both A1 segments are present. Patent anterior communicating artery (a-comm). --Middle cerebral arteries (MCA): Normal. VENOUS SINUSES: As permitted by contrast timing, patent. ANATOMIC VARIANTS: Fetal origin of the left posterior cerebral artery. Diminutive right P-comm. Review of the MIP images confirms the above findings. IMPRESSION: 1. No emergent large vessel occlusion or high-grade stenosis of the intracranial or cervical arteries. 2. Aortic Atherosclerosis (ICD10-I70.0). Electronically Signed   By: Deatra Robinson M.D.    On: 07/01/2020 02:01   MR BRAIN WO CONTRAST  Result Date: 07/01/2020 CLINICAL DATA:  TIA EXAM: MRI HEAD WITHOUT CONTRAST TECHNIQUE: Multiplanar, multiecho pulse sequences of the brain and surrounding structures were obtained without intravenous contrast. COMPARISON:  Head CT 06/30/2020 Brain MRI 04/03/2020 FINDINGS: Brain: No acute infarct, acute hemorrhage or extra-axial collection. Diffuse confluent hyperintense T2-weighted signal within the periventricular, deep and juxtacortical white matter, most commonly due to chronic ischemic microangiopathy. There is generalized atrophy without lobar predilection. No chronic microhemorrhage. Normal midline structures. Vascular: Normal flow voids. Skull and upper cervical spine: Normal marrow signal. Sinuses/Orbits: Small amount of bilateral mastoid fluid. Orbits are normal. Other: None. IMPRESSION: 1. No acute intracranial abnormality. 2. Advanced chronic ischemic microangiopathy and generalized atrophy without lobar predilection. Electronically Signed   By: Deatra Robinson M.D.   On: 07/01/2020 00:32   CT ANGIO HEAD CODE STROKE  Result Date: 07/01/2020 CLINICAL DATA:  Focal neuro deficit, > 6 hrs, stroke suspected; Stroke/TIA, assess extracranial arteries EXAM: CT ANGIOGRAPHY HEAD AND NECK TECHNIQUE: Multidetector CT imaging of the head and neck was performed using the standard protocol during bolus administration of intravenous contrast. Multiplanar  CT image reconstructions and MIPs were obtained to evaluate the vascular anatomy. Carotid stenosis measurements (when applicable) are obtained utilizing NASCET criteria, using the distal internal carotid diameter as the denominator. CONTRAST:  OMNIPAQUE IOHEXOL 350 MG/ML SOLN COMPARISON:  04/03/2020 FINDINGS: CTA NECK FINDINGS SKELETON: There is no bony spinal canal stenosis. No lytic or blastic lesion. OTHER NECK: Normal pharynx, larynx and major salivary glands. No cervical lymphadenopathy. Unremarkable thyroid  gland. UPPER CHEST: No pneumothorax or pleural effusion. No nodules or masses. AORTIC ARCH: There is mild calcific atherosclerosis of the aortic arch. There is no aneurysm, dissection or hemodynamically significant stenosis of the visualized portion of the aorta. Conventional 3 vessel aortic branching pattern. The visualized proximal subclavian arteries are widely patent. RIGHT CAROTID SYSTEM: Normal without aneurysm, dissection or stenosis. LEFT CAROTID SYSTEM: Normal without aneurysm, dissection or stenosis. VERTEBRAL ARTERIES: Left dominant configuration. Both origins are clearly patent. There is no dissection, occlusion or flow-limiting stenosis to the skull base (V1-V3 segments). CTA HEAD FINDINGS POSTERIOR CIRCULATION: --Vertebral arteries: Normal V4 segments. --Inferior cerebellar arteries: Normal. --Basilar artery: Normal. --Superior cerebellar arteries: Normal. --Posterior cerebral arteries (PCA): Normal. ANTERIOR CIRCULATION: --Intracranial internal carotid arteries: Atherosclerotic calcification of the internal carotid arteries at the skull base without hemodynamically significant stenosis. --Anterior cerebral arteries (ACA): Normal. Both A1 segments are present. Patent anterior communicating artery (a-comm). --Middle cerebral arteries (MCA): Normal. VENOUS SINUSES: As permitted by contrast timing, patent. ANATOMIC VARIANTS: Fetal origin of the left posterior cerebral artery. Diminutive right P-comm. Review of the MIP images confirms the above findings. IMPRESSION: 1. No emergent large vessel occlusion or high-grade stenosis of the intracranial or cervical arteries. 2. Aortic Atherosclerosis (ICD10-I70.0). Electronically Signed   By: Deatra Robinson M.D.   On: 07/01/2020 02:01     I have reviewed the above imaging : MRI brain and CTA head and neck   ASSESSMENT AND PLAN  86 y.o. female with past medical history of hypertension, hypothyroidism, neuropathy, TIA, bilateral distal ICA stenosis presents  to the emergency department at [facility] transient ischemic attack.  Recommendations MRI brain: No acute ischemic stroke CTA Head and neck: No significant change in atherosclerotic disease Echocardiogram, normal to repeat performed on May 2021 Consider 30-day Holter monitor/loop recorder for monitoring for paroxysmal atrial fibrillation Correct hyponatremia Gradually reduce blood pressure to normotension No need for A1c and lipid profile Continue aspirin and statin, may consider Plavix 75 mg daily into 3 weeks  Tonnia Bardin [facility] Pager Number [phone]

## 2020-07-01 NOTE — ED Notes (Signed)
Floor coverage APP paged notified that pt passed swallow screen

## 2020-07-01 NOTE — Procedures (Signed)
History: 86 year old female being evaluated for transient episode of visual change.  Sedation: None  Technique: This is a 21 channel routine scalp EEG performed at the bedside with bipolar and monopolar montages arranged in accordance to the international 10/20 system of electrode placement. One channel was dedicated to EKG recording.    Background: The majority of the recording occurs during drowsiness and light sleep, during which time there is generalized irregular delta activity.  With arousal, however, the background consists  of intermixed alpha and beta activities. There is a well defined posterior dominant rhythm of nine hz that attenuates with eye opening. Sleep is recorded with normal appearing structures.   Photic stimulation: Physiologic driving is not performed  EEG Abnormalities: None  Clinical Interpretation: This normal EEG is recorded in the waking and sleep state. There was no seizure or seizure predisposition recorded on this study. Please note that lack of epileptiform activity on EEG does not preclude the possibility of epilepsy.   Ritta Slot, MD [facility] [phone]  If 7pm- 7am, please page neurology on call as listed in AMION.

## 2020-07-01 NOTE — Progress Notes (Signed)
Unable to place in a room for EEG per nurse per charge nurse, due to no rooms being available. Patient remains at H011. Nurse Felts will notify EEG if situation changes.

## 2020-07-01 NOTE — Progress Notes (Addendum)
Pt will need to be placed in a room for EEG to be performed.  At this moment she is in H011.

## 2020-07-02 ENCOUNTER — Other Ambulatory Visit: Payer: Self-pay | Admitting: Student

## 2020-07-02 DIAGNOSIS — I455 Other specified heart block: Secondary | ICD-10-CM

## 2020-07-02 DIAGNOSIS — G459 Transient cerebral ischemic attack, unspecified: Secondary | ICD-10-CM

## 2020-07-02 LAB — BASIC METABOLIC PANEL
Anion gap: 9 (ref 5–15)
BUN: 12 mg/dL (ref 8–23)
CO2: 23 mmol/L (ref 22–32)
Calcium: 9.1 mg/dL (ref 8.9–10.3)
Chloride: 99 mmol/L (ref 98–111)
Creatinine, Ser: 0.62 mg/dL (ref 0.44–1.00)
GFR calc Af Amer: >60 mL/min (ref >60)
GFR calc non Af Amer: >60 mL/min (ref >60)
Glucose, Bld: 131 mg/dL — ABNORMAL HIGH (ref 70–99)
Potassium: 4.7 mmol/L (ref 3.5–5.1)
Sodium: 131 mmol/L — ABNORMAL LOW (ref 135–145)

## 2020-07-02 LAB — TSH: TSH: 4.068 u[IU]/mL (ref 0.350–4.500)

## 2020-07-02 MED ORDER — ATORVASTATIN CALCIUM 20 MG PO TABS: 20.0000 mg | ORAL_TABLET | Freq: Every day | ORAL | 0 refills | Status: AC

## 2020-07-02 NOTE — Progress Notes (Signed)
Ordered 30-day event monitor for further evaluation of TIA and pause noted on telemetry per Dr. Elease Hashimoto. Will also send message to schedulers to arrange New Patient visit in our office.

## 2020-07-02 NOTE — Plan of Care (Signed)
  Problem: Education: Goal: Knowledge of General Education information will improve Description: Including pain rating scale, medication(s)/side effects and non-pharmacologic comfort measures Outcome: Progressing   Problem: Health Behavior/Discharge Planning: Goal: Ability to manage health-related needs will improve Outcome: Progressing   Problem: Clinical Measurements: Goal: Ability to maintain clinical measurements within normal limits will improve Outcome: Progressing Goal: Will remain free from infection Outcome: Progressing Goal: Diagnostic test results will improve Outcome: Progressing Goal: Respiratory complications will improve Outcome: Progressing Goal: Cardiovascular complication will be avoided Outcome: Progressing   Problem: Activity: Goal: Risk for activity intolerance will decrease Outcome: Progressing   Problem: Nutrition: Goal: Adequate nutrition will be maintained Outcome: Progressing   Problem: Coping: Goal: Level of anxiety will decrease Outcome: Progressing   Problem: Elimination: Goal: Will not experience complications related to bowel motility Outcome: Progressing Goal: Will not experience complications related to urinary retention Outcome: Progressing   Problem: Pain Managment: Goal: General experience of comfort will improve Outcome: Progressing   Problem: Safety: Goal: Ability to remain free from injury will improve Outcome: Progressing   Problem: Skin Integrity: Goal: Risk for impaired skin integrity will decrease Outcome: Progressing   Problem: Education: Goal: Knowledge of disease or condition will improve Outcome: Progressing Goal: Knowledge of secondary prevention will improve Outcome: Progressing Goal: Knowledge of patient specific risk factors addressed and post discharge goals established will improve Outcome: Progressing Goal: Individualized Educational Video(s) Outcome: Progressing   Problem: Coping: Goal: Will verbalize  positive feelings about self Outcome: Progressing Goal: Will identify appropriate support needs Outcome: Progressing   

## 2020-07-02 NOTE — Plan of Care (Signed)
Problem: Education: Goal: Knowledge of General Education information will improve Description: Including pain rating scale, medication(s)/side effects and non-pharmacologic comfort measures 07/02/2020 1429 by Coy Saunas, RN Outcome: Adequate for Discharge 07/02/2020 1012 by Coy Saunas, RN Outcome: Progressing   Problem: Health Behavior/Discharge Planning: Goal: Ability to manage health-related needs will improve 07/02/2020 1429 by Coy Saunas, RN Outcome: Adequate for Discharge 07/02/2020 1012 by Coy Saunas, RN Outcome: Progressing   Problem: Clinical Measurements: Goal: Ability to maintain clinical measurements within normal limits will improve 07/02/2020 1429 by Coy Saunas, RN Outcome: Adequate for Discharge 07/02/2020 1012 by Coy Saunas, RN Outcome: Progressing Goal: Will remain free from infection 07/02/2020 1429 by Coy Saunas, RN Outcome: Adequate for Discharge 07/02/2020 1012 by Coy Saunas, RN Outcome: Progressing Goal: Diagnostic test results will improve 07/02/2020 1429 by Coy Saunas, RN Outcome: Adequate for Discharge 07/02/2020 1012 by Coy Saunas, RN Outcome: Progressing Goal: Respiratory complications will improve 07/02/2020 1429 by Coy Saunas, RN Outcome: Adequate for Discharge 07/02/2020 1012 by Coy Saunas, RN Outcome: Progressing Goal: Cardiovascular complication will be avoided 07/02/2020 1429 by Coy Saunas, RN Outcome: Adequate for Discharge 07/02/2020 1012 by Coy Saunas, RN Outcome: Progressing   Problem: Activity: Goal: Risk for activity intolerance will decrease 07/02/2020 1429 by Coy Saunas, RN Outcome: Adequate for Discharge 07/02/2020 1012 by Coy Saunas, RN Outcome: Progressing   Problem: Nutrition: Goal: Adequate nutrition will be maintained 07/02/2020 1429 by Coy Saunas, RN Outcome: Adequate for Discharge 07/02/2020 1012 by Coy Saunas, RN Outcome: Progressing   Problem: Coping: Goal: Level of anxiety  will decrease 07/02/2020 1429 by Coy Saunas, RN Outcome: Adequate for Discharge 07/02/2020 1012 by Coy Saunas, RN Outcome: Progressing   Problem: Elimination: Goal: Will not experience complications related to bowel motility 07/02/2020 1429 by Coy Saunas, RN Outcome: Adequate for Discharge 07/02/2020 1012 by Coy Saunas, RN Outcome: Progressing Goal: Will not experience complications related to urinary retention 07/02/2020 1429 by Coy Saunas, RN Outcome: Adequate for Discharge 07/02/2020 1012 by Coy Saunas, RN Outcome: Progressing   Problem: Pain Managment: Goal: General experience of comfort will improve 07/02/2020 1429 by Coy Saunas, RN Outcome: Adequate for Discharge 07/02/2020 1012 by Coy Saunas, RN Outcome: Progressing   Problem: Safety: Goal: Ability to remain free from injury will improve 07/02/2020 1429 by Coy Saunas, RN Outcome: Adequate for Discharge 07/02/2020 1012 by Coy Saunas, RN Outcome: Progressing   Problem: Skin Integrity: Goal: Risk for impaired skin integrity will decrease 07/02/2020 1429 by Coy Saunas, RN Outcome: Adequate for Discharge 07/02/2020 1012 by Coy Saunas, RN Outcome: Progressing   Problem: Education: Goal: Knowledge of disease or condition will improve 07/02/2020 1429 by Coy Saunas, RN Outcome: Adequate for Discharge 07/02/2020 1012 by Coy Saunas, RN Outcome: Progressing Goal: Knowledge of secondary prevention will improve 07/02/2020 1429 by Coy Saunas, RN Outcome: Adequate for Discharge 07/02/2020 1012 by Coy Saunas, RN Outcome: Progressing Goal: Knowledge of patient specific risk factors addressed and post discharge goals established will improve 07/02/2020 1429 by Coy Saunas, RN Outcome: Adequate for Discharge 07/02/2020 1012 by Coy Saunas, RN Outcome: Progressing Goal: Individualized Educational Video(s) 07/02/2020 1429 by Coy Saunas, RN Outcome: Adequate for Discharge 07/02/2020 1012 by  Coy Saunas, RN Outcome: Progressing   Problem: Coping: Goal: Will verbalize positive feelings about self 07/02/2020 1429 by Manson Passey,  Verita Schneiders, RN Outcome: Adequate for Discharge 07/02/2020 1012 by Coy Saunas, RN Outcome: Progressing Goal: Will identify appropriate support needs 07/02/2020 1429 by Coy Saunas, RN Outcome: Adequate for Discharge 07/02/2020 1012 by Coy Saunas, RN Outcome: Progressing   Problem: Health Behavior/Discharge Planning: Goal: Ability to manage health-related needs will improve 07/02/2020 1429 by Coy Saunas, RN Outcome: Adequate for Discharge 07/02/2020 1012 by Coy Saunas, RN Outcome: Progressing   Problem: Self-Care: Goal: Ability to participate in self-care as condition permits will improve 07/02/2020 1429 by Coy Saunas, RN Outcome: Adequate for Discharge 07/02/2020 1012 by Coy Saunas, RN Outcome: Progressing Goal: Verbalization of feelings and concerns over difficulty with self-care will improve 07/02/2020 1429 by Coy Saunas, RN Outcome: Adequate for Discharge 07/02/2020 1012 by Coy Saunas, RN Outcome: Progressing Goal: Ability to communicate needs accurately will improve 07/02/2020 1429 by Coy Saunas, RN Outcome: Adequate for Discharge 07/02/2020 1012 by Coy Saunas, RN Outcome: Progressing   Problem: Nutrition: Goal: Risk of aspiration will decrease 07/02/2020 1429 by Coy Saunas, RN Outcome: Adequate for Discharge 07/02/2020 1012 by Coy Saunas, RN Outcome: Progressing Goal: Dietary intake will improve 07/02/2020 1429 by Coy Saunas, RN Outcome: Adequate for Discharge 07/02/2020 1012 by Coy Saunas, RN Outcome: Progressing   Problem: Ischemic Stroke/TIA Tissue Perfusion: Goal: Complications of ischemic stroke/TIA will be minimized 07/02/2020 1429 by Coy Saunas, RN Outcome: Adequate for Discharge 07/02/2020 1012 by Coy Saunas, RN Outcome: Progressing   Problem: Spontaneous Subarachnoid Hemorrhage Tissue  Perfusion: Goal: Complications of Spontaneous Subarachnoid Hemorrhage will be minimized 07/02/2020 1429 by Coy Saunas, RN Outcome: Adequate for Discharge 07/02/2020 1012 by Coy Saunas, RN Outcome: Progressing

## 2020-07-02 NOTE — Progress Notes (Signed)
Patient geven discharge instructions and stated understanding.  Patients son coming to pick her up from the hospital.  Helped patient get dressed, she was walking around her room with her walker and left via wheelchair.

## 2020-07-02 NOTE — Progress Notes (Signed)
SLP Cancellation Note  Patient Details Name: Meghan Kim MRN: [id number] DOB: 06-02-1926   Cancelled treatment:       Reason Eval/Treat Not Completed: SLP screened, no needs identified, will sign off   Dawud Mays, Riley Nearing 07/02/2020, 7:30 AM

## 2020-07-02 NOTE — Care Management Obs Status (Signed)
MEDICARE OBSERVATION STATUS NOTIFICATION   Patient Details  Name: Meghan Kim MRN: [id number] Date of Birth: 03/07/1926   Medicare Observation Status Notification Given:  Yes    Deveron Furlong, RN 07/02/2020, 12:47 PM

## 2020-07-02 NOTE — Discharge Summary (Signed)
Meghan Kim RSW:[id number] DOB: Mar 10, 1926 DOA: 06/30/2020  PCP: [facility]  Admit date: 06/30/2020 Discharge date: 07/02/2020  Admitted From: home Disposition:  home  Recommendations for Outpatient Follow-up:  1. Follow up with PCP in 1 week 2. Please obtain BMP/CBC in one week 3. F/u with neurology Dr. Roda Shutters at stroke clinic on 08/23/20 4. Will need to f/u with cardiology referal by her pcp for monitor as she does not want to come to [place] to see cardiology  Home Health:    Discharge Condition:Stable CODE STATUS: Full Diet recommendation: Heart Healthy Brief/Interim Summary: Meghan Kim is a 86 y.o. female with medical history significant for HTN, hypothyroidism, TIA, peripheral neuropathy, OA who presents from [facility] for MRI and stroke workup. She reports was at home and home health PT came to work with her and noticed she had elevated SBP to over 160. Her PCP was called who advised her to go to ER to be seen. Pt was evaluated at [facility] and all of her symptoms had resolved. She was sent to [facility] ER for MRI brain which was done and showed no acute stroke. She had CTA head and neck performed which showed no large vessel occlusion.  She states that she did not have any change in her speech, difficulty swallowing, drooping face, numbness or weakness of extremities.  She did not have any syncope or dizziness.  She states she did not have any ataxia or incoordination.  She states she noticed her vision became double which lasted for 3 to 4 minutes and then resolved spontaneously this happened with headache apparently. She lives alone.  She does not drink alcohol or use illicit drugs.  She reports a history of smoking but quit over 25 years ago.Neurology was consulted.   Transiet double vision associated with HA- per neurology likely complicated migraine- DDX also TIA  MRI HA- negative abn. CT no acute abn. EEG- no seizure Echo NML EF. Neurology consulted recommend ASA 81mg   qd, f/u with stroke clinic on 08/23/20 at [facility]. Cards consulted for monitor placement and f/u with clinic Continue statin  Essential Hypertension Continue outpt arb and amlodipine F/u with pcp for further mx   Hyponatremia Hold lasix.  Improved with ivf. Na  126...>131 today  Elevated troponin level Mildly elevated. Likely demand ischemia.    PT refused PT/OT. Ambulated per nsg.   Discharge Diagnoses:  Principal Problem:   TIA (transient ischemic attack) Active Problems:   Hypertension   Hyponatremia   Elevated troponin    Discharge Instructions  Discharge Instructions    Call MD for:  difficulty breathing, headache or visual disturbances   Complete by: As directed    Diet - low sodium heart healthy   Complete by: As directed    Discharge instructions   Complete by: As directed    Need to follow up with pcp next week, pcp needs to send you for heart monitor with a cardiologist close to where you live   Increase activity slowly   Complete by: As directed      Allergies as of 07/02/2020      Reactions   Latex Itching, Rash   Penicillins Swelling   "internal swelling"   Tetanus Toxoid, Adsorbed Swelling   Sulfamethoxazole-trimethoprim Other (See Comments)   Unknown reaction   Adhesive [tape] Other (See Comments)   Causes skin tears   Morphine Nausea Only      Medication List    STOP taking these medications   acetaminophen 650 MG  CR tablet Commonly known as: TYLENOL   furosemide 40 MG tablet Commonly known as: LASIX     TAKE these medications   alendronate 70 MG tablet Commonly known as: FOSAMAX Take 70 mg by mouth every Friday.   amLODipine 5 MG tablet Commonly known as: NORVASC Take 5 mg by mouth daily.   ammonium lactate 12 % lotion Commonly known as: LAC-HYDRIN Apply 1 application topically 2 (two) times daily as needed (arm and hand itching).   aspirin 81 MG EC tablet Take 1 tablet (81 mg total) by mouth daily.   atorvastatin 20 MG  tablet Commonly known as: LIPITOR Take 1 tablet (20 mg total) by mouth daily. Start taking on: July 03, 2020   clobetasol 0.05 % external solution Commonly known as: TEMOVATE Apply 1 application topically 2 (two) times daily as needed (scalp itching).   docusate sodium 100 MG capsule Commonly known as: COLACE Take 100 mg by mouth daily with breakfast.   Dupixent 300 MG/2ML prefilled syringe Generic drug: dupilumab Inject 300 mg into the skin every 14 (fourteen) days. Every other Saturday   Dupixent 300 MG/2ML prefilled syringe Generic drug: dupilumab Inject into the skin.   FISH OIL PO Take 1 capsule by mouth daily with breakfast.   fluticasone 50 MCG/ACT nasal spray Commonly known as: FLONASE Place 2 sprays into both nostrils 2 (two) times daily.   gabapentin 300 MG capsule Commonly known as: NEURONTIN Take 300 mg by mouth at bedtime.   GLUCOSAMINE-CHONDROITIN PO Take 1 tablet by mouth daily with breakfast.   levothyroxine 50 MCG tablet Commonly known as: SYNTHROID Take 50 mcg by mouth daily before breakfast.   MAGNESIUM PO Take 1 tablet by mouth at bedtime.   mirabegron ER 25 MG Tb24 tablet Commonly known as: MYRBETRIQ Take 25 mg by mouth daily with breakfast.   Multivitamin Gummies Womens Chew Chew 2 tablets by mouth daily with breakfast.   polyethylene glycol 17 g packet Commonly known as: MIRALAX / GLYCOLAX Take 17 g by mouth daily with breakfast. Mix in 4-8 oz water and drink   rOPINIRole 0.25 MG tablet Commonly known as: REQUIP Take 0.25 mg by mouth at bedtime.   temazepam 15 MG capsule Commonly known as: RESTORIL Take 15 mg by mouth at bedtime.   valsartan 80 MG tablet Commonly known as: DIOVAN Take 80 mg by mouth daily with breakfast.   WAL-FEX PO Take 1 tablet by mouth daily with breakfast.       Follow-up Information    Meghan AustinMcCue, Jessica, NP. Go on 08/23/2020.   Specialty: Neurology Contact information: [address] [place]  [id number] [phone]              Allergies  Allergen Reactions  . Latex Itching and Rash  . Penicillins Swelling    "internal swelling"  . Tetanus Toxoid, Adsorbed Swelling  . Sulfamethoxazole-Trimethoprim Other (See Comments)    Unknown reaction   . Adhesive [Tape] Other (See Comments)    Causes skin tears  . Morphine Nausea Only    Consultations:  neurology   Procedures/Studies: CT Head Wo Contrast  Result Date: 06/30/2020 CLINICAL DATA:  Headache. EXAM: CT HEAD WITHOUT CONTRAST TECHNIQUE: Contiguous axial images were obtained from the base of the skull through the vertex without intravenous contrast. COMPARISON:  Apr 02, 2020 FINDINGS: Brain: There is moderate severity cerebral atrophy with widening of the extra-axial spaces and ventricular dilatation. There are areas of decreased attenuation within the white matter tracts of the supratentorial  brain, consistent with microvascular disease changes Vascular: No hyperdense vessel or unexpected calcification. Skull: Normal. Negative for fracture or focal lesion. Sinuses/Orbits: No acute finding. Other: None. IMPRESSION: 1. Generalized cerebral atrophy. 2. No acute intracranial abnormality. Electronically Signed   By: Aram Candela M.D.   On: 06/30/2020 19:30   CT ANGIO NECK W OR WO CONTRAST  Result Date: 07/01/2020 CLINICAL DATA:  Focal neuro deficit, > 6 hrs, stroke suspected; Stroke/TIA, assess extracranial arteries EXAM: CT ANGIOGRAPHY HEAD AND NECK TECHNIQUE: Multidetector CT imaging of the head and neck was performed using the standard protocol during bolus administration of intravenous contrast. Multiplanar CT image reconstructions and MIPs were obtained to evaluate the vascular anatomy. Carotid stenosis measurements (when applicable) are obtained utilizing NASCET criteria, using the distal internal carotid diameter as the denominator. CONTRAST:  OMNIPAQUE IOHEXOL 350 MG/ML SOLN COMPARISON:  04/03/2020 FINDINGS: CTA  NECK FINDINGS SKELETON: There is no bony spinal canal stenosis. No lytic or blastic lesion. OTHER NECK: Normal pharynx, larynx and major salivary glands. No cervical lymphadenopathy. Unremarkable thyroid gland. UPPER CHEST: No pneumothorax or pleural effusion. No nodules or masses. AORTIC ARCH: There is mild calcific atherosclerosis of the aortic arch. There is no aneurysm, dissection or hemodynamically significant stenosis of the visualized portion of the aorta. Conventional 3 vessel aortic branching pattern. The visualized proximal subclavian arteries are widely patent. RIGHT CAROTID SYSTEM: Normal without aneurysm, dissection or stenosis. LEFT CAROTID SYSTEM: Normal without aneurysm, dissection or stenosis. VERTEBRAL ARTERIES: Left dominant configuration. Both origins are clearly patent. There is no dissection, occlusion or flow-limiting stenosis to the skull base (V1-V3 segments). CTA HEAD FINDINGS POSTERIOR CIRCULATION: --Vertebral arteries: Normal V4 segments. --Inferior cerebellar arteries: Normal. --Basilar artery: Normal. --Superior cerebellar arteries: Normal. --Posterior cerebral arteries (PCA): Normal. ANTERIOR CIRCULATION: --Intracranial internal carotid arteries: Atherosclerotic calcification of the internal carotid arteries at the skull base without hemodynamically significant stenosis. --Anterior cerebral arteries (ACA): Normal. Both A1 segments are present. Patent anterior communicating artery (a-comm). --Middle cerebral arteries (MCA): Normal. VENOUS SINUSES: As permitted by contrast timing, patent. ANATOMIC VARIANTS: Fetal origin of the left posterior cerebral artery. Diminutive right P-comm. Review of the MIP images confirms the above findings. IMPRESSION: 1. No emergent large vessel occlusion or high-grade stenosis of the intracranial or cervical arteries. 2. Aortic Atherosclerosis (ICD10-I70.0). Electronically Signed   By: Deatra Robinson M.D.   On: 07/01/2020 02:01   MR BRAIN WO  CONTRAST  Result Date: 07/01/2020 CLINICAL DATA:  TIA EXAM: MRI HEAD WITHOUT CONTRAST TECHNIQUE: Multiplanar, multiecho pulse sequences of the brain and surrounding structures were obtained without intravenous contrast. COMPARISON:  Head CT 06/30/2020 Brain MRI 04/03/2020 FINDINGS: Brain: No acute infarct, acute hemorrhage or extra-axial collection. Diffuse confluent hyperintense T2-weighted signal within the periventricular, deep and juxtacortical white matter, most commonly due to chronic ischemic microangiopathy. There is generalized atrophy without lobar predilection. No chronic microhemorrhage. Normal midline structures. Vascular: Normal flow voids. Skull and upper cervical spine: Normal marrow signal. Sinuses/Orbits: Small amount of bilateral mastoid fluid. Orbits are normal. Other: None. IMPRESSION: 1. No acute intracranial abnormality. 2. Advanced chronic ischemic microangiopathy and generalized atrophy without lobar predilection. Electronically Signed   By: Deatra Robinson M.D.   On: 07/01/2020 00:32   EEG adult  Result Date: 07/01/2020 Rejeana Brock, MD     07/01/2020  6:18 PM History: 86 year old female being evaluated for transient episode of visual change. Sedation: None Technique: This is a 21 channel routine scalp EEG performed at the bedside with bipolar and monopolar montages  arranged in accordance to the international 10/20 system of electrode placement. One channel was dedicated to EKG recording. Background: The majority of the recording occurs during drowsiness and light sleep, during which time there is generalized irregular delta activity.  With arousal, however, the background consists  of intermixed alpha and beta activities. There is a well defined posterior dominant rhythm of nine hz that attenuates with eye opening. Sleep is recorded with normal appearing structures. Photic stimulation: Physiologic driving is not performed EEG Abnormalities: None Clinical Interpretation: This  normal EEG is recorded in the waking and sleep state. There was no seizure or seizure predisposition recorded on this study. Please note that lack of epileptiform activity on EEG does not preclude the possibility of epilepsy. Ritta Slot, MD [facility] [phone] If 7pm- 7am, please page neurology on call as listed in AMION.   EEG adult        [facility] [address] [place]. [place] [address] [phone]      Electroencephalogram Procedure Note Ms. Jolissa Kapral Date of Birth:  16-Jan-1926 Medical Record Number:  [id number] Indications: Diagnostic Date of Procedure : 06/07/2020 Medications: gabapentin (Neurontin) Clinical history : 86 year old patient being evaluated for memory loss and seizures Technical Description This study was performed using 17 channel digital electroencephalographic recording equipment. International 10-20 electrode placement was used. The record was obtained with the patient awake, drowsy and asleep.  The record is of fair technical quality for purposes of interpretation. Activation Procedures:  photic stimulation . EEG Description Awake: Alpha Activity: The waking state record contains a well-defined bi-occipital alpha rhythm of  low amplitude with a dominant frequency of 7-8 Hz.  With intermittent 6 to 7 Hz generalized slowing of background activity.  Reactivity is absent. No paroxsymal activity, spikes, or sharp waves are noted. Technical component of study is adequate. EKG tracing shows regular sinus rhythm Length of this recording is 24 minutes and 39 seconds Sleep: With drowsiness, there is attenuation of the background alpha activity. As the patient enters into light sleep, vertex waves and symmetrical spindles are noted. K complexes are noted in sleep. Transition to the waking state is unremarkable. Result of Activation Procedures: Hyperventilation: N/A. Photo Stimulation: No photic driving response is noted. Summary This is a mildly  abnormal EEG due to the presence of mild bihemispheric dysfunction which is a nonspecific finding seen in a variety of conditions including her age, degenerative, toxic metabolic and hypoxic conditions.  No definite epileptiform features were noted.   CT ANGIO HEAD CODE STROKE  Result Date: 07/01/2020 CLINICAL DATA:  Focal neuro deficit, > 6 hrs, stroke suspected; Stroke/TIA, assess extracranial arteries EXAM: CT ANGIOGRAPHY HEAD AND NECK TECHNIQUE: Multidetector CT imaging of the head and neck was performed using the standard protocol during bolus administration of intravenous contrast. Multiplanar CT image reconstructions and MIPs were obtained to evaluate the vascular anatomy. Carotid stenosis measurements (when applicable) are obtained utilizing NASCET criteria, using the distal internal carotid diameter as the denominator. CONTRAST:  OMNIPAQUE IOHEXOL 350 MG/ML SOLN COMPARISON:  04/03/2020 FINDINGS: CTA NECK FINDINGS SKELETON: There is no bony spinal canal stenosis. No lytic or blastic lesion. OTHER NECK: Normal pharynx, larynx and major salivary glands. No cervical lymphadenopathy. Unremarkable thyroid gland. UPPER CHEST: No pneumothorax or pleural effusion. No nodules or masses. AORTIC ARCH: There is mild calcific atherosclerosis of the aortic arch. There is no aneurysm, dissection or hemodynamically significant stenosis of the visualized portion of the aorta. Conventional 3 vessel aortic branching pattern. The visualized  proximal subclavian arteries are widely patent. RIGHT CAROTID SYSTEM: Normal without aneurysm, dissection or stenosis. LEFT CAROTID SYSTEM: Normal without aneurysm, dissection or stenosis. VERTEBRAL ARTERIES: Left dominant configuration. Both origins are clearly patent. There is no dissection, occlusion or flow-limiting stenosis to the skull base (V1-V3 segments). CTA HEAD FINDINGS POSTERIOR CIRCULATION: --Vertebral arteries: Normal V4 segments. --Inferior cerebellar arteries:  Normal. --Basilar artery: Normal. --Superior cerebellar arteries: Normal. --Posterior cerebral arteries (PCA): Normal. ANTERIOR CIRCULATION: --Intracranial internal carotid arteries: Atherosclerotic calcification of the internal carotid arteries at the skull base without hemodynamically significant stenosis. --Anterior cerebral arteries (ACA): Normal. Both A1 segments are present. Patent anterior communicating artery (a-comm). --Middle cerebral arteries (MCA): Normal. VENOUS SINUSES: As permitted by contrast timing, patent. ANATOMIC VARIANTS: Fetal origin of the left posterior cerebral artery. Diminutive right P-comm. Review of the MIP images confirms the above findings. IMPRESSION: 1. No emergent large vessel occlusion or high-grade stenosis of the intracranial or cervical arteries. 2. Aortic Atherosclerosis (ICD10-I70.0). Electronically Signed   By: Deatra Robinson M.D.   On: 07/01/2020 02:01       Subjective: Has no complaints. No HA or vision changes  Discharge Exam: Vitals:   07/02/20 0035 07/02/20 0616  BP: (!) 117/59 (!) 150/58  Pulse: 61 (!) 58  Resp: 17 16  Temp: 98.6 F (37 C) 97.6 F (36.4 C)  SpO2: 96% 96%   Vitals:   07/01/20 1639 07/01/20 1847 07/02/20 0035 07/02/20 0616  BP: (!) 185/71 100/63 (!) 117/59 (!) 150/58  Pulse: 70 100 61 (!) 58  Resp: 16 16 17 16   Temp: 98.3 F (36.8 C) 98.3 F (36.8 C) 98.6 F (37 C) 97.6 F (36.4 C)  TempSrc: Oral Oral Oral Oral  SpO2: 99% 98% 96% 96%  Weight:      Height:        General: AAxox3 not in acute distress Cardiovascular: RRR, S1/S2 +, no rubs, no gallops Respiratory: CTA bilaterally, no wheezing, no rhonchi Abdominal: Soft, NT, ND, bowel sounds + Extremities: no edema, no cyanosis    The results of significant diagnostics from this hospitalization (including imaging, microbiology, ancillary and laboratory) are listed below for reference.     Microbiology: Recent Results (from the past 240 hour(s))  SARS  Coronavirus 2 by RT PCR (hospital order, performed in [facility] hospital lab) Nasopharyngeal Nasopharyngeal Swab     Status: None   Collection Time: 07/01/20  3:00 AM   Specimen: Nasopharyngeal Swab  Result Value Ref Range Status   SARS Coronavirus 2 NEGATIVE NEGATIVE Final    Comment: (NOTE) SARS-CoV-2 target nucleic acids are NOT DETECTED.  The SARS-CoV-2 RNA is generally detectable in upper and lower respiratory specimens during the acute phase of infection. The lowest concentration of SARS-CoV-2 viral copies this assay can detect is 250 copies / mL. A negative result does not preclude SARS-CoV-2 infection and should not be used as the sole basis for treatment or other patient management decisions.  A negative result may occur with improper specimen collection / handling, submission of specimen other than nasopharyngeal swab, presence of viral mutation(s) within the areas targeted by this assay, and inadequate number of viral copies (<250 copies / mL). A negative result must be combined with clinical observations, patient history, and epidemiological information.  Fact Sheet for Patients:   08/31/20  Fact Sheet for Healthcare Providers: BoilerBrush.com.cy  This test is not yet approved or  cleared by the https://pope.com/ FDA and has been authorized for detection and/or diagnosis of SARS-CoV-2 by FDA  under an Emergency Use Authorization (EUA).  This EUA will remain in effect (meaning this test can be used) for the duration of the COVID-19 declaration under Section 564(b)(1) of the Act, 21 U.S.C. section 360bbb-3(b)(1), unless the authorization is terminated or revoked sooner.  Performed at [facility], [address], [place], [place] [address]      Labs: BNP (last 3 results) No results for input(s): BNP in the last 8760 hours. Basic Metabolic Panel: Recent Labs  Lab 06/30/20 1806 07/01/20 0945  07/02/20 0841  NA 125* 127* 131*  K 4.4 3.8 4.7  CL 92* 96* 99  CO2 24 21* 23  GLUCOSE 136* 95 131*  BUN CREATININE 0.78 0.45 0.62  CALCIUM 9.3 8.8* 9.1   Liver Function Tests: Recent Labs  Lab 07/01/20 0945  AST 21  ALT 15  ALKPHOS 62  BILITOT 1.0  PROT 6.1*  ALBUMIN 3.4*   No results for input(s): LIPASE, AMYLASE in the last 168 hours. No results for input(s): AMMONIA in the last 168 hours. CBC: Recent Labs  Lab 06/30/20 1806  WBC 5.6  HGB 11.6*  HCT 36.9  MCV 67.7*  PLT 253   Cardiac Enzymes: No results for input(s): CKTOTAL, CKMB, CKMBINDEX, TROPONINI in the last 168 hours. BNP: Invalid input(s): POCBNP CBG: No results for input(s): GLUCAP in the last 168 hours. D-Dimer No results for input(s): DDIMER in the last 72 hours. Hgb A1c No results for input(s): HGBA1C in the last 72 hours. Lipid Profile Recent Labs    07/01/20 0630  CHOL 168  HDL 73  LDLCALC 87  TRIG 41  CHOLHDL 2.3   Thyroid function studies Recent Labs    07/02/20 0841  TSH 4.068   Anemia work up No results for input(s): VITAMINB12, FOLATE, FERRITIN, TIBC, IRON, RETICCTPCT in the last 72 hours. Urinalysis    Component Value Date/Time   COLORURINE YELLOW 06/30/2020 1908   APPEARANCEUR HAZY (A) 06/30/2020 1908   LABSPEC 1.015 06/30/2020 1908   PHURINE 7.5 06/30/2020 1908   GLUCOSEU NEGATIVE 06/30/2020 1908   HGBUR NEGATIVE 06/30/2020 1908   BILIRUBINUR NEGATIVE 06/30/2020 1908   KETONESUR NEGATIVE 06/30/2020 1908   PROTEINUR NEGATIVE 06/30/2020 1908   NITRITE NEGATIVE 06/30/2020 1908   LEUKOCYTESUR SMALL (A) 06/30/2020 1908   Sepsis Labs Invalid input(s): PROCALCITONIN,  WBC,  LACTICIDVEN Microbiology Recent Results (from the past 240 hour(s))  SARS Coronavirus 2 by RT PCR (hospital order, performed in [facility] hospital lab) Nasopharyngeal Nasopharyngeal Swab     Status: None   Collection Time: 07/01/20  3:00 AM   Specimen: Nasopharyngeal Swab  Result Value  Ref Range Status   SARS Coronavirus 2 NEGATIVE NEGATIVE Final    Comment: (NOTE) SARS-CoV-2 target nucleic acids are NOT DETECTED.  The SARS-CoV-2 RNA is generally detectable in upper and lower respiratory specimens during the acute phase of infection. The lowest concentration of SARS-CoV-2 viral copies this assay can detect is 250 copies / mL. A negative result does not preclude SARS-CoV-2 infection and should not be used as the sole basis for treatment or other patient management decisions.  A negative result may occur with improper specimen collection / handling, submission of specimen other than nasopharyngeal swab, presence of viral mutation(s) within the areas targeted by this assay, and inadequate number of viral copies (<250 copies / mL). A negative result must be combined with clinical observations, patient history, and epidemiological information.  Fact Sheet for Patients:   BoilerBrush.com.cy  Fact Sheet for Healthcare Providers: https://pope.com/  This test is not yet approved or  cleared by the [place] FDA and has been authorized for detection and/or diagnosis of SARS-CoV-2 by FDA under an Emergency Use Authorization (EUA).  This EUA will remain in effect (meaning this test can be used) for the duration of the COVID-19 declaration under Section 564(b)(1) of the Act, 21 U.S.C. section 360bbb-3(b)(1), unless the authorization is terminated or revoked sooner.  Performed at [facility], [address] [place], [place] [address]      Time coordinating discharge: Over 30 minutes  SIGNED:   Lynn Ito, MD  [facility] 07/02/2020, 12:14 PM Pager   If 7PM-7AM, please contact night-coverage [email or web address] Password [email or web address]

## 2020-07-10 ENCOUNTER — Ambulatory Visit (INDEPENDENT_AMBULATORY_CARE_PROVIDER_SITE_OTHER): Payer: Medicare Other

## 2020-07-10 DIAGNOSIS — I455 Other specified heart block: Secondary | ICD-10-CM

## 2020-07-10 DIAGNOSIS — G459 Transient cerebral ischemic attack, unspecified: Secondary | ICD-10-CM | POA: Diagnosis not present

## 2020-07-10 DIAGNOSIS — I4891 Unspecified atrial fibrillation: Secondary | ICD-10-CM

## 2020-07-21 ENCOUNTER — Telehealth: Payer: Self-pay

## 2020-07-21 NOTE — Telephone Encounter (Addendum)
Received monitor report that this morning at 0237 CST the patient had sustained afib at a rate of 50 bpm.  Called the patient and she states she had no symptoms as she was sleeping. She was awoken by the phone call from the monitor company (she thinks) but did not get the phone in time to speak with anyone. She understands monitor will be reviewed with MD and she will be called with instructions  She was hospitalized 8/8 for TIA. Monitor was ordered at discharge. She has an appointment with Dr. Dulce Sellar next month.  Faxed strips to Dr. Hulen Shouts nurse.  Spoke with Dr. Hulen Shouts nurse who spoke with Dr. Servando Salina. Scheduled the patient with Dr. Servando Salina 8/30. She and her caregiver understands the importance of the visit. They were grateful for assistance.   They request all follow-up visits scheduled in [facility].

## 2020-07-24 ENCOUNTER — Encounter: Payer: Self-pay | Admitting: Cardiology

## 2020-07-24 ENCOUNTER — Other Ambulatory Visit: Payer: Self-pay

## 2020-07-24 ENCOUNTER — Ambulatory Visit: Payer: Medicare Other | Admitting: Cardiology

## 2020-07-24 VITALS — BP 136/60 | HR 80 | Ht 62.0 in | Wt 131.4 lb

## 2020-07-24 DIAGNOSIS — I480 Paroxysmal atrial fibrillation: Secondary | ICD-10-CM

## 2020-07-24 DIAGNOSIS — E782 Mixed hyperlipidemia: Secondary | ICD-10-CM

## 2020-07-24 DIAGNOSIS — G459 Transient cerebral ischemic attack, unspecified: Secondary | ICD-10-CM

## 2020-07-24 DIAGNOSIS — I10 Essential (primary) hypertension: Secondary | ICD-10-CM

## 2020-07-24 NOTE — Progress Notes (Signed)
Cardiology Office Note:    Date:  07/24/2020   ID:  Meghan Kim, DOB 16-Aug-1926, MRN [id number]  PCP:  [facility], [facility]  Cardiologist:  Thomasene Ripple, DO  Electrophysiologist:  None   Referring MD: Health, [facility]*   Establish cardiac care  History of Present Illness:    Meghan Kim is a 86 y.o. female with a hx of hypertension, hyperlipidemia recent TIA the patient was hospitalized at the [facility] from August 6 to August 8.  Discharged home.  Concerns of her TIA to monitor was placed on the patient to monitor for atrial fibrillation.  In the interim the patient tells me that she did get a call once and was told that she was in A. fib.  I do have the strips from preventives here showing that day 12 of wearing her 30-day monitor she was in atrial fibrillation with slow ventricular rate.  Today she denies any chest pain, shortness of breath, nausea, vomiting.  She tells me that she is doing everything at home without any problems.  She is here with her aide Darla.  Past Medical History:  Diagnosis Date  . Arthritis   . Hypertension   . Hypothyroidism   . Neuropathy   . Thyroid disease     Past Surgical History:  Procedure Laterality Date  . arm surgery    . HIP SURGERY    . SHOULDER SURGERY    . WRIST SURGERY      Current Medications: Current Meds  Medication Sig  . alendronate (FOSAMAX) 70 MG tablet Take 70 mg by mouth every Friday.   Marland Kitchen amLODipine (NORVASC) 5 MG tablet Take 5 mg by mouth daily.  Marland Kitchen ammonium lactate (LAC-HYDRIN) 12 % lotion Apply 1 application topically 2 (two) times daily as needed (arm and hand itching).   Marland Kitchen aspirin EC 81 MG EC tablet Take 1 tablet (81 mg total) by mouth daily.  Marland Kitchen atorvastatin (LIPITOR) 20 MG tablet Take 1 tablet (20 mg total) by mouth daily.  . clobetasol (TEMOVATE) 0.05 % external solution Apply 1 application topically 2 (two) times daily as needed (scalp itching).   Marland Kitchen docusate sodium (COLACE)  100 MG capsule Take 100 mg by mouth daily with breakfast.  . dupilumab (DUPIXENT) 300 MG/2ML prefilled syringe Inject 300 mg into the skin every 14 (fourteen) days. Every other Saturday  . Fexofenadine HCl (WAL-FEX PO) Take 1 tablet by mouth daily with breakfast.  . GLUCOSAMINE-CHONDROITIN PO Take 1 tablet by mouth daily with breakfast.  . levothyroxine (SYNTHROID) 50 MCG tablet Take 50 mcg by mouth daily before breakfast.   . MAGNESIUM PO Take 1 tablet by mouth at bedtime.  . mirabegron ER (MYRBETRIQ) 25 MG TB24 tablet Take 25 mg by mouth daily with breakfast.   . Multiple Vitamins-Minerals (MULTIVITAMIN GUMMIES WOMENS) CHEW Chew 2 tablets by mouth daily with breakfast.  . Omega-3 Fatty Acids (FISH OIL PO) Take 1 capsule by mouth daily with breakfast.  . polyethylene glycol (MIRALAX / GLYCOLAX) 17 g packet Take 17 g by mouth daily with breakfast. Mix in 4-8 oz water and drink  . temazepam (RESTORIL) 15 MG capsule Take 15 mg by mouth at bedtime.   . valsartan (DIOVAN) 80 MG tablet Take 80 mg by mouth daily with breakfast.      Allergies:   Latex; Penicillins; Tetanus toxoid, adsorbed; Sulfamethoxazole-trimethoprim; Adhesive [tape]; and Morphine   Social History   Socioeconomic History  . Marital status: Unknown    Spouse  name: Not on file  . Number of children: Not on file  . Years of education: Not on file  . Highest education level: Not on file  Occupational History  . Not on file  Tobacco Use  . Smoking status: Former Games developermoker  . Smokeless tobacco: Never Used  Vaping Use  . Vaping Use: Never used  Substance and Sexual Activity  . Alcohol use: Yes    Comment: occ  . Drug use: Never  . Sexual activity: Not on file  Other Topics Concern  . Not on file  Social History Narrative  . Not on file   Social Determinants of Health   Financial Resource Strain:   . Difficulty of Paying Living Expenses: Not on file  Food Insecurity:   . Worried About Programme researcher, broadcasting/film/videounning Out of Food in the Last  Year: Not on file  . Ran Out of Food in the Last Year: Not on file  Transportation Needs:   . Lack of Transportation (Medical): Not on file  . Lack of Transportation (Non-Medical): Not on file  Physical Activity:   . Days of Exercise per Week: Not on file  . Minutes of Exercise per Session: Not on file  Stress:   . Feeling of Stress : Not on file  Social Connections:   . Frequency of Communication with Friends and Family: Not on file  . Frequency of Social Gatherings with Friends and Family: Not on file  . Attends Religious Services: Not on file  . Active Member of Clubs or Organizations: Not on file  . Attends BankerClub or Organization Meetings: Not on file  . Marital Status: Not on file     Family History: The patient's family history is not on file.  ROS:   Review of Systems  Constitution: Negative for decreased appetite, fever and weight gain.  HENT: Negative for congestion, ear discharge, hoarse voice and sore throat.   Eyes: Negative for discharge, redness, vision loss in right eye and visual halos.  Cardiovascular: Negative for chest pain, dyspnea on exertion, leg swelling, orthopnea and palpitations.  Respiratory: Negative for cough, hemoptysis, shortness of breath and snoring.   Endocrine: Negative for heat intolerance and polyphagia.  Hematologic/Lymphatic: Negative for bleeding problem. Does not bruise/bleed easily.  Skin: Negative for flushing, nail changes, rash and suspicious lesions.  Musculoskeletal: Negative for arthritis, joint pain, muscle cramps, myalgias, neck pain and stiffness.  Gastrointestinal: Negative for abdominal pain, bowel incontinence, diarrhea and excessive appetite.  Genitourinary: Negative for decreased libido, genital sores and incomplete emptying.  Neurological: Negative for brief paralysis, focal weakness, headaches and loss of balance.  Psychiatric/Behavioral: Negative for altered mental status, depression and suicidal ideas.    Allergic/Immunologic: Negative for HIV exposure and persistent infections.    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  Echocardiogram IMPRESSIONS dated 09/13/2020 1. Left ventricular ejection fraction, by estimation, is 65 to 70%. The left ventricle has normal function. The left ventricle has no regional wall motion abnormalities. There is moderate left ventricular hypertrophy.  Left ventricular diastolic parameters are indeterminate.  2. Right ventricular systolic function is normal. The right ventricular size is normal. Tricuspid regurgitation signal is inadequate for assessing PA pressure.  3. The mitral valve is normal in structure. No evidence of mitral valve regurgitation.  4. The aortic valve is tricuspid. Aortic valve regurgitation is not visualized. Mild aortic valve sclerosis is present, with no evidence of aortic valve stenosis.   Recent Labs: 06/30/2020: Hemoglobin 11.6; Platelets 253 07/01/2020: ALT 15 07/02/2020: BUN 12; Creatinine, Ser 0.62; Potassium 4.7; Sodium 131; TSH 4.068  Recent Lipid Panel    Component Value Date/Time   CHOL 168 07/01/2020 0630   TRIG 41 07/01/2020 0630   HDL 73 07/01/2020 0630   CHOLHDL 2.3 07/01/2020 0630   VLDL 8 07/01/2020 0630   LDLCALC 87 07/01/2020 0630    Physical Exam:    VS:  BP 136/60   Pulse 80   Ht 5\' 2"  (1.575 m)   Wt 131 lb 6.4 oz (59.6 kg)   SpO2 97%   BMI 24.03 kg/m     Wt Readings from Last 3 Encounters:  07/24/20 131 lb 6.4 oz (59.6 kg)  06/30/20 124 lb (56.2 kg)  05/09/20 131 lb 3.2 oz (59.5 kg)     GEN: Well nourished, well developed in no acute distress HEENT: Normal NECK: No JVD; No carotid bruits LYMPHATICS: No lymphadenopathy CARDIAC: S1S2 noted,RRR, no  murmurs, rubs, gallops RESPIRATORY:  Clear to auscultation without rales, wheezing or rhonchi  ABDOMEN: Soft, non-tender, non-distended, +bowel sounds, no guarding. EXTREMITIES: No edema, No cyanosis, no clubbing MUSCULOSKELETAL:  No deformity  SKIN: Warm and dry NEUROLOGIC:  Alert and oriented x 3, non-focal PSYCHIATRIC:  Normal affect, good insight  ASSESSMENT:    1. Essential hypertension   2. TIA (transient ischemic attack)   3. PAF (paroxysmal atrial fibrillation) (HCC)   4. Mixed hyperlipidemia    PLAN:     I did educate the patient about her diagnosis of atrial fibrillation.  This is paroxysmal atrial fibrillation as she is in sinus rhythm today.  I talked to the patient about the treatments for atrial fibrillation which included rate control, rhythm control as well as prevention.  Currently she is not on any rate control agents I discussed with the patient would like to switch her on her antihypertensive medications where I can put her on rate control agent as well as stroke prevention will be indicated at this time given the fact that her chadsvasc score is 4 making her risk of stroke significantly increase.  At this time she tells me that she is on aspirin and does not want to take any anticoagulation.  I explained to the patient the difference between the anticoagulation as well as the aspirin as in the setting of atrial fibrillation only anticoagulant will really help with her stroke prevention.  But the patient is very adamant and even her aide also made comments stating that she does not need anticoagulation.  I did request that she give me a number for a family member who helps her with her health decision which is her son 05/11/20 [phone] so I can be able to update him with this visit and the fact that she does have paroxysmal atrial fibrillation and decision on anticoagulation.    Hypertension-her blood pressure is acceptable continue her current antihypertensive  medication. Hyperlipidemia continue patient on her current dose of Lipitor. Recent TIA continue aspirin as well as statin.  The patient is in agreement with the above plan. The patient left the office in stable condition.  The patient will follow up in 1 month or sooner if needed.   Medication Adjustments/Labs and Tests Ordered: Current medicines are reviewed at length with the patient today.  Concerns regarding medicines  are outlined above.  Orders Placed This Encounter  Procedures  . EKG 12-Lead   No orders of the defined types were placed in this encounter.   Patient Instructions  Medication Instructions:   Your physician recommends that you continue on your current medications as directed. Please refer to the Current Medication list given to you today.  *If you need a refill on your cardiac medications before your next appointment, please call your pharmacy*   Lab Work: NONE ORDERED  TODAY   If you have labs (blood work) drawn today and your tests are completely normal, you will receive your results only by: Marland Kitchen MyChart Message (if you have MyChart) OR . A paper copy in the mail If you have any lab test that is abnormal or we need to change your treatment, we will call you to review the results.   Testing/Procedures: NONE ORDERED  TODAY   Follow-Up: At [facility], you and your health needs are our priority.  As part of our continuing mission to provide you with exceptional heart care, we have created designated Provider Care Teams.  These Care Teams include your primary Cardiologist (physician) and Advanced Practice Providers (APPs -  Physician Assistants and Nurse Practitioners) who all work together to provide you with the care you need, when you need it.  We recommend signing up for the patient portal called "MyChart".  Sign up information is provided on this After Visit Summary.  MyChart is used to connect with patients for Virtual Visits (Telemedicine).  Patients are  able to view lab/test results, encounter notes, upcoming appointments, etc.  Non-urgent messages can be sent to your provider as well.   To learn more about what you can do with MyChart, go to [email or web address].    Your next appointment:   1 month(s)  The format for your next appointment:   In Person  Provider:   You will see Dr. Servando Salina.  Or, you can be scheduled with the following Advanced Practice Provider on your designated Care Team (at our [facility]):  Gillian Shields, FNP     Other Instructions      Adopting a Healthy Lifestyle.  Know what a healthy weight is for you (roughly BMI <25) and aim to maintain this   Aim for 7+ servings of fruits and vegetables daily   65-80+ fluid ounces of water or unsweet tea for healthy kidneys   Limit to max 1 drink of alcohol per day; avoid smoking/tobacco   Limit animal fats in diet for cholesterol and heart health - choose grass fed whenever available   Avoid highly processed foods, and foods high in saturated/trans fats   Aim for low stress - take time to unwind and care for your mental health   Aim for 150 min of moderate intensity exercise weekly for heart health, and weights twice weekly for bone health   Aim for 7-9 hours of sleep daily   When it comes to diets, agreement about the perfect plan isnt easy to find, even among the experts. Experts at the [facility] of [employer] developed an idea known as the Healthy Eating Plate. Just imagine a plate divided into logical, healthy portions.   The emphasis is on diet quality:   Load up on vegetables and fruits - one-half of your plate: Aim for color and variety, and remember that potatoes dont count.   Go for whole grains - one-quarter of your plate: Whole wheat, barley, wheat berries, quinoa, oats, brown rice,  and foods made with them. If you want pasta, go with whole wheat pasta.   Protein power - one-quarter of your plate: Fish, chicken, beans, and  nuts are all healthy, versatile protein sources. Limit red meat.   The diet, however, does go beyond the plate, offering a few other suggestions.   Use healthy plant oils, such as olive, canola, soy, corn, sunflower and peanut. Check the labels, and avoid partially hydrogenated oil, which have unhealthy trans fats.   If youre thirsty, drink water. Coffee and tea are good in moderation, but skip sugary drinks and limit milk and dairy products to one or two daily servings.   The type of carbohydrate in the diet is more important than the amount. Some sources of carbohydrates, such as vegetables, fruits, whole grains, and beans-are healthier than others.   Finally, stay active  Signed, Thomasene Ripple, DO  07/24/2020 5:13 PM    [facility]

## 2020-07-24 NOTE — Patient Instructions (Signed)
Medication Instructions:   Your physician recommends that you continue on your current medications as directed. Please refer to the Current Medication list given to you today.  *If you need a refill on your cardiac medications before your next appointment, please call your pharmacy*   Lab Work: NONE ORDERED  TODAY   If you have labs (blood work) drawn today and your tests are completely normal, you will receive your results only by: Marland Kitchen MyChart Message (if you have MyChart) OR . A paper copy in the mail If you have any lab test that is abnormal or we need to change your treatment, we will call you to review the results.   Testing/Procedures: NONE ORDERED  TODAY   Follow-Up: At [facility], you and your health needs are our priority.  As part of our continuing mission to provide you with exceptional heart care, we have created designated Provider Care Teams.  These Care Teams include your primary Cardiologist (physician) and Advanced Practice Providers (APPs -  Physician Assistants and Nurse Practitioners) who all work together to provide you with the care you need, when you need it.  We recommend signing up for the patient portal called "MyChart".  Sign up information is provided on this After Visit Summary.  MyChart is used to connect with patients for Virtual Visits (Telemedicine).  Patients are able to view lab/test results, encounter notes, upcoming appointments, etc.  Non-urgent messages can be sent to your provider as well.   To learn more about what you can do with MyChart, go to [email or web address].    Your next appointment:   1 month(s)  The format for your next appointment:   In Person  Provider:   You will see Dr. Servando Salina.  Or, you can be scheduled with the following Advanced Practice Provider on your designated Care Team (at our [facility]):  Gillian Shields, FNP     Other Instructions

## 2020-08-10 ENCOUNTER — Other Ambulatory Visit: Payer: Self-pay | Admitting: Student

## 2020-08-10 DIAGNOSIS — I455 Other specified heart block: Secondary | ICD-10-CM

## 2020-08-10 DIAGNOSIS — I4891 Unspecified atrial fibrillation: Secondary | ICD-10-CM

## 2020-08-10 DIAGNOSIS — G459 Transient cerebral ischemic attack, unspecified: Secondary | ICD-10-CM

## 2020-08-21 ENCOUNTER — Ambulatory Visit: Payer: Medicare Other | Admitting: Cardiology

## 2020-08-23 ENCOUNTER — Other Ambulatory Visit: Payer: Self-pay

## 2020-08-23 ENCOUNTER — Ambulatory Visit (INDEPENDENT_AMBULATORY_CARE_PROVIDER_SITE_OTHER): Payer: Medicare Other | Admitting: Adult Health

## 2020-08-23 ENCOUNTER — Encounter: Payer: Self-pay | Admitting: Adult Health

## 2020-08-23 VITALS — BP 126/62 | HR 52 | Ht 61.0 in | Wt 130.0 lb

## 2020-08-23 DIAGNOSIS — R2990 Unspecified symptoms and signs involving the nervous system: Secondary | ICD-10-CM

## 2020-08-23 DIAGNOSIS — G629 Polyneuropathy, unspecified: Secondary | ICD-10-CM

## 2020-08-23 DIAGNOSIS — I480 Paroxysmal atrial fibrillation: Secondary | ICD-10-CM

## 2020-08-23 NOTE — Patient Instructions (Signed)
Continue aspirin 81 mg daily  and atorvastatin for secondary stroke prevention  Continue to follow up with PCP regarding cholesterol and blood pressure management  Maintain strict control of hypertension with blood pressure goal below 130/90 and cholesterol with LDL cholesterol (bad cholesterol) goal below 70 mg/dL.        Thank you for coming to see us at [facility]. I hope we have been able to provide you high quality care today.  You may receive a patient satisfaction survey over the next few weeks. We would appreciate your feedback and comments so that we may continue to improve ourselves and the health of our patients.    

## 2020-08-23 NOTE — Progress Notes (Signed)
[facility] [address] [place]. [place] [address] [phone]       OFFICE FOLLOW-UP NOTE  [name] Date of Birth:  10-26-1926 Medical Record Number:  [id number]    Chief complaint: TIA  HPI:   Today, 08/23/2020, [name] is being seen for 42-month follow-up accompanied by her friend.  Since prior visit, presented to ED on 06/30/2020 for 10 to 15-minute transient episode of "split vision" associated with a headache likely due to complicated migraine vs TIA vs seizure although less likely.  Due to possible TIA, recommended cardiac monitor to assess for atrial fibrillation.  Cardiac monitor did show evidence of atrial fibrillation. Follow-up with cardiology on 07/24/2020 and personally reviewed cardiology note.  CHA2DS2-VASc score 4 and recommended initiating anticoagulation but patient adamantly declined and remained on aspirin.   Since that time, she has been stable without new or reoccurring stroke/TIA symptoms.  Cognition has been stable without worsening.  She remains on aspirin 81 mg daily and atorvastatin 20 mg daily for secondary stroke prevention. Monitors blood pressure routinely which has been stable with today's level 126/62.  She does report recent fall evaluated in ED on 9/7 resulting in facial laceration.  She has not had any reoccurring falls since that time.  Complaints of chronic continue paresthesias currently on gabapentin managed by PCP.  No further concerns at this time.    History provided for reference purposes only Initial consult visit 05/09/2020 Dr. Pearlean Brownie: [name] is a 86 year old pleasant Caucasian lady seen today for initial office follow-up visit following hospital consultation for code stroke.  History is obtained from the patient as well as review of electronic medical records and I personally reviewed imaging films in PACS.Prestyn Meghan Kim is an 86 y.o. female with HTN, neuropathy and thyroid disease who initially  presented yesterday evening with acute onset of pain in her lower chest and upper abdomen along with nausea. She was hypertensive and tachycardic in the ED with first degree AV nodal block. She was hyponatremic with Na of 123. AST, ALT and lipase were elevated and she had a leukocytosis of 18,800. CT abdomen revealed a distended gallbladder with pericholecystic inflammatory changes and significant intra and extrehepatic biliary ductal dilatation concerning for acute cholecystitis or choledocholithiasis. Inflammatory changes of the pancreatic head were also noted. GI was consulted for further evaluation of suspected acute cholecystitis/biliary pancreatitis/rule out choledocholithiasis. On 04/02/20 at 0730 she was noted to be normal. At 9:15 AM she was noted to have trouble getting her words out with word finding difficulty. She was clearly aware of the deficit and frustrated with it. She had no significant difficulty following commands. No focal motor or sensory deficit was noted by RN, except for possible mild right facial droop.   Her NIH stroke scale score was 3.  She was given IV TPA and admitted to the intensive care unit for blood pressure tightly controlled.  Her facial droop and speech improved quickly.  CT angiogram of the brain and neck showed no significant large vessel stenosis or occlusion.  MRI scan of the brain showed no acute infarct.  Transthoracic echo showed normal ejection fraction.  LDL cholesterol was 72 mg percent.  Hemoglobin A1c was 5.4.  Patient has done well and is living at home and has a caregiver during the daytime.  She has chronic neuropathy and paresthesias in the feet.  She takes gabapentin 300 mg at night.  She feels her arthritis and neuropathy pain and paresthesias are getting worse.  She  has not tried taking gabapentin during the day yet.  The patient and caregiver both state that for the last several weeks there has been significant decrease in memory as well as train of thought  with she finding difficulty finding words at times searching in sentences.  She is able to ambulate with a walker and a cane and uses wheelchair for long distances.  She had hip surgery and since then her walking has gotten worse.  She has not been evaluated for memory loss yet.    ROS:   14 system review of systems is positive for those listed in HPI and all other systems negative  PMH:  Past Medical History:  Diagnosis Date  . Arthritis   . Hypertension   . Hypothyroidism   . Neuropathy   . Thyroid disease     Social History:  Social History   Socioeconomic History  . Marital status: Unknown    Spouse name: Not on file  . Number of children: Not on file  . Years of education: Not on file  . Highest education level: Not on file  Occupational History  . Not on file  Tobacco Use  . Smoking status: Former Games developermoker  . Smokeless tobacco: Never Used  Vaping Use  . Vaping Use: Never used  Substance and Sexual Activity  . Alcohol use: Yes    Comment: occ  . Drug use: Never  . Sexual activity: Not on file  Other Topics Concern  . Not on file  Social History Narrative  . Not on file   Social Determinants of Health   Financial Resource Strain:   . Difficulty of Paying Living Expenses: Not on file  Food Insecurity:   . Worried About Programme researcher, broadcasting/film/videounning Out of Food in the Last Year: Not on file  . Ran Out of Food in the Last Year: Not on file  Transportation Needs:   . Lack of Transportation (Medical): Not on file  . Lack of Transportation (Non-Medical): Not on file  Physical Activity:   . Days of Exercise per Week: Not on file  . Minutes of Exercise per Session: Not on file  Stress:   . Feeling of Stress : Not on file  Social Connections:   . Frequency of Communication with Friends and Family: Not on file  . Frequency of Social Gatherings with Friends and Family: Not on file  . Attends Religious Services: Not on file  . Active Member of Clubs or Organizations: Not on file  .  Attends BankerClub or Organization Meetings: Not on file  . Marital Status: Not on file  Intimate Partner Violence:   . Fear of Current or Ex-Partner: Not on file  . Emotionally Abused: Not on file  . Physically Abused: Not on file  . Sexually Abused: Not on file    Medications:   Current Outpatient Medications on File Prior to Visit  Medication Sig Dispense Refill  . alendronate (FOSAMAX) 70 MG tablet Take 70 mg by mouth every Friday.     Marland Kitchen. aspirin EC 81 MG EC tablet Take 1 tablet (81 mg total) by mouth daily. 30 tablet 3  . atorvastatin (LIPITOR) 20 MG tablet Take 1 tablet (20 mg total) by mouth daily. 30 tablet 0  . docusate sodium (COLACE) 100 MG capsule Take 100 mg by mouth daily with breakfast.    . dupilumab (DUPIXENT) 300 MG/2ML prefilled syringe Inject 300 mg into the skin every 14 (fourteen) days. Every other Saturday    . Fexofenadine  HCl (WAL-FEX PO) Take 1 tablet by mouth daily with breakfast.    . fluticasone (FLONASE) 50 MCG/ACT nasal spray Place 2 sprays into both nostrils 2 (two) times daily.     Marland Kitchen GLUCOSAMINE-CHONDROITIN PO Take 1 tablet by mouth daily with breakfast.    . levothyroxine (SYNTHROID) 50 MCG tablet Take 50 mcg by mouth daily before breakfast.     . MAGNESIUM PO Take 1 tablet by mouth at bedtime.    . mirabegron ER (MYRBETRIQ) 25 MG TB24 tablet Take 25 mg by mouth daily with breakfast.     . Multiple Vitamins-Minerals (MULTIVITAMIN GUMMIES WOMENS) CHEW Chew 2 tablets by mouth daily with breakfast.    . Omega-3 Fatty Acids (FISH OIL PO) Take 1 capsule by mouth daily with breakfast.    . polyethylene glycol (MIRALAX / GLYCOLAX) 17 g packet Take 17 g by mouth daily with breakfast. Mix in 4-8 oz water and drink    . rOPINIRole (REQUIP) 0.25 MG tablet Take 0.25 mg by mouth at bedtime.     . temazepam (RESTORIL) 15 MG capsule Take 15 mg by mouth at bedtime.     . valsartan (DIOVAN) 80 MG tablet Take 80 mg by mouth daily with breakfast.     . gabapentin (NEURONTIN) 300  MG capsule Take 300 mg by mouth at bedtime.      No current facility-administered medications on file prior to visit.    Allergies:   Allergies  Allergen Reactions  . Latex Itching and Rash  . Penicillins Swelling    "internal swelling"  . Tetanus Toxoid, Adsorbed Swelling  . Sulfamethoxazole-Trimethoprim Other (See Comments)    Unknown reaction   . Adhesive [Tape] Other (See Comments)    Causes skin tears  . Morphine Nausea Only    Physical Exam Today's Vitals   08/23/20 1435  BP: 126/62  Pulse: (!) 52  Weight: 130 lb (59 kg)  Height: 5\' 1"  (1.549 m)   Body mass index is 24.56 kg/m.  General: Very pleasant frail elderly Caucasian lady, seated in wheelchair, in no evident distress Head: head normocephalic and atraumatic.  Neck: supple with no carotid or supraclavicular bruits Cardiovascular: regular rate and rhythm, no murmurs Musculoskeletal: no deformity Skin:  no rash/petichiae Vascular:  Normal pulses all extremities  Neurologic Exam Mental Status: Awake and fully alert.  Fluent speech and language.  Oriented to place and time. Recent memory impaired and remote memory intact. Attention span, concentration and fund of knowledge appropriate during visit. Mood and affect appropriate.  Cranial Nerves: Pupils equal, briskly reactive to light. Extraocular movements full without nystagmus. Visual fields full to confrontation. Hearing diminished bilaterally despite hearing aids. Facial sensation intact. Face, tongue, palate moves normally and symmetrically.  Motor: Normal bulk and tone. Normal strength in all tested extremity muscles. Sensory.: Decreased sensation proximal BUE and distal BLE -chronic Coordination: Rapid alternating movements normal in all extremities. Finger-to-nose performed accurately bilaterally and heel-to-shin performed accurately on right side but difficulty on left side due to history of hip injury. Gait and Station: Deferred as currently in  wheelchair Reflexes: 1+ and symmetric. Toes downgoing.      ASSESSMENT/PLAN: 86 year old Caucasian lady with recent transient double vision associated with headache possibly complicated migraine vs TIA on 06/30/2020 and prior transient episode of expressive aphasia in May 2011 possible TIA treated with IV TPA.  Cardiac monitor showed evidence of atrial fibrillation but patient declines interest in anticoagulation therapy.    STROKE LIKE EPISODE -Continue aspirin 81 mg daily  and atorvastatin 20 mg daily for secondary stroke prevention -Discussed secondary stroke prevention measures and importance of close PCP follow-up for aggressive stroke risk factor management including HTN with BP goal<130/90 and HLD with LDL goal<70  ATRIAL FIBRILLATION -Currently in regular rate and rhythm -CHA2DS2-VASc score 4; HAS-BLED score 3 -Patient declines interest in anticoagulation due to bleeding risk especially with recent fall.  She verbalizes understanding of stroke risk not on anticoagulation -Continue to follow with cardiology  NEUROPATHY -Chronic currently managed by PCP -Continue gabapentin per PCP   Per patient request, she was referred to neurology clinic through [facility] to establish care with providers within the [facility] group.  Follow-up on an as-needed basis.   I spent 30 minutes of face-to-face and non-face-to-face time with patient and friend.  This included previsit chart review, lab review, study review, order entry, electronic health record documentation, patient education and discussion regarding history of TIA, new diagnosis of atrial fibrillation, chronic history of neuropathy, importance of managing stroke risk factors and answered all questions to patient satisfaction   Ihor Austin, AGNP-BC  [facility] [address] [place], [place] [address]  Phone [phone] Fax [phone] Note: This document was prepared with digital dictation  and possible smart phrase technology. Any transcriptional errors that result from this process are unintentional.

## 2020-08-24 NOTE — Progress Notes (Signed)
I agree with the above plan 

## 2020-08-28 ENCOUNTER — Ambulatory Visit (INDEPENDENT_AMBULATORY_CARE_PROVIDER_SITE_OTHER): Payer: Medicare Other | Admitting: Cardiology

## 2020-08-28 ENCOUNTER — Other Ambulatory Visit: Payer: Self-pay

## 2020-08-28 ENCOUNTER — Encounter: Payer: Self-pay | Admitting: Cardiology

## 2020-08-28 VITALS — BP 134/50 | HR 56 | Ht 61.0 in | Wt 130.0 lb

## 2020-08-28 DIAGNOSIS — G459 Transient cerebral ischemic attack, unspecified: Secondary | ICD-10-CM | POA: Diagnosis not present

## 2020-08-28 DIAGNOSIS — I10 Essential (primary) hypertension: Secondary | ICD-10-CM

## 2020-08-28 DIAGNOSIS — I480 Paroxysmal atrial fibrillation: Secondary | ICD-10-CM | POA: Diagnosis not present

## 2020-08-28 NOTE — Patient Instructions (Signed)

## 2020-08-28 NOTE — Progress Notes (Signed)
Cardiology Office Note:    Date:  08/28/2020   ID:  Meghan Kim, DOB [birth year], MRN [id number]  PCP:  Health, [facility]  Cardiologist:  Thomasene Ripple, DO  Electrophysiologist:  None   Referring MD: Health, [facility]*   Chief Complaint  Patient presents with  . Follow-up    History of Present Illness:    Meghan Kim is a 86 y.o. female with a hx of hypertension, hyperlipidemia recent TIA, paroxysmal atrial fibrillation, today for follow-up visit.  I did see the patient after hospitalization at [facility] for TIA at which time she was wearing to prevent his monitor.  That visit was prompted due to a call from preventives noting that the patient was in A. fib with slow ventricular rate.  We did have the strip showing that the patient had A. fib slow ventricular rate.  At that visit the patient declined the use of anticoagulation and remain on her aspirin.   Past Medical History:  Diagnosis Date  . Arthritis   . Hypertension   . Hypothyroidism   . Neuropathy   . Thyroid disease     Past Surgical History:  Procedure Laterality Date  . arm surgery    . HIP SURGERY    . SHOULDER SURGERY    . WRIST SURGERY      Current Medications: Current Meds  Medication Sig  . alendronate (FOSAMAX) 70 MG tablet Take 70 mg by mouth every Friday.   Marland Kitchen aspirin EC 81 MG EC tablet Take 1 tablet (81 mg total) by mouth daily.  Marland Kitchen atorvastatin (LIPITOR) 20 MG tablet Take 1 tablet (20 mg total) by mouth daily.  Marland Kitchen docusate sodium (COLACE) 100 MG capsule Take 100 mg by mouth daily with breakfast.  . dupilumab (DUPIXENT) 300 MG/2ML prefilled syringe Inject 300 mg into the skin every 14 (fourteen) days. Every other Saturday  . Fexofenadine HCl (WAL-FEX PO) Take 1 tablet by mouth daily with breakfast.  . fluticasone (FLONASE) 50 MCG/ACT nasal spray Place 2 sprays into both nostrils 2 (two) times daily.   Marland Kitchen gabapentin (NEURONTIN) 100 MG capsule Take 300 mg by mouth 2 (two)  times daily.  Marland Kitchen GLUCOSAMINE-CHONDROITIN PO Take 1 tablet by mouth daily with breakfast.  . levothyroxine (SYNTHROID) 50 MCG tablet Take 50 mcg by mouth daily before breakfast.   . MAGNESIUM PO Take 1 tablet by mouth at bedtime.  . mirabegron ER (MYRBETRIQ) 25 MG TB24 tablet Take 25 mg by mouth daily with breakfast.   . Multiple Vitamins-Minerals (MULTIVITAMIN GUMMIES WOMENS) CHEW Chew 2 tablets by mouth daily with breakfast.  . Omega-3 Fatty Acids (FISH OIL PO) Take 1 capsule by mouth daily with breakfast.  . polyethylene glycol (MIRALAX / GLYCOLAX) 17 g packet Take 17 g by mouth daily with breakfast. Mix in 4-8 oz water and drink  . rOPINIRole (REQUIP) 0.25 MG tablet Take 0.25 mg by mouth at bedtime.   . temazepam (RESTORIL) 15 MG capsule Take 15 mg by mouth at bedtime.   . valsartan (DIOVAN) 160 MG tablet Take 160 mg by mouth daily.     Allergies:   Latex; Penicillins; Tetanus toxoid, adsorbed; Sulfamethoxazole-trimethoprim; Adhesive [tape]; and Morphine   Social History   Socioeconomic History  . Marital status: Unknown    Spouse name: Not on file  . Number of children: Not on file  . Years of education: Not on file  . Highest education level: Not on file  Occupational History  . Not on file  Tobacco Use  . Smoking status: Former Games developermoker  . Smokeless tobacco: Never Used  Vaping Use  . Vaping Use: Never used  Substance and Sexual Activity  . Alcohol use: Yes    Comment: occ  . Drug use: Never  . Sexual activity: Not on file  Other Topics Concern  . Not on file  Social History Narrative  . Not on file   Social Determinants of Health   Financial Resource Strain:   . Difficulty of Paying Living Expenses: Not on file  Food Insecurity:   . Worried About Programme researcher, broadcasting/film/videounning Out of Food in the Last Year: Not on file  . Ran Out of Food in the Last Year: Not on file  Transportation Needs:   . Lack of Transportation (Medical): Not on file  . Lack of Transportation (Non-Medical): Not on  file  Physical Activity:   . Days of Exercise per Week: Not on file  . Minutes of Exercise per Session: Not on file  Stress:   . Feeling of Stress : Not on file  Social Connections:   . Frequency of Communication with Friends and Family: Not on file  . Frequency of Social Gatherings with Friends and Family: Not on file  . Attends Religious Services: Not on file  . Active Member of Clubs or Organizations: Not on file  . Attends BankerClub or Organization Meetings: Not on file  . Marital Status: Not on file     Family History: The patient's family history is not on file.  ROS:   Review of Systems  Constitution: Negative for decreased appetite, fever and weight gain.  HENT: Negative for congestion, ear discharge, hoarse voice and sore throat.   Eyes: Negative for discharge, redness, vision loss in right eye and visual halos.  Cardiovascular: Negative for chest pain, dyspnea on exertion, leg swelling, orthopnea and palpitations.  Respiratory: Negative for cough, hemoptysis, shortness of breath and snoring.   Endocrine: Negative for heat intolerance and polyphagia.  Hematologic/Lymphatic: Negative for bleeding problem. Does not bruise/bleed easily.  Skin: Negative for flushing, nail changes, rash and suspicious lesions.  Musculoskeletal: Negative for arthritis, joint pain, muscle cramps, myalgias, neck pain and stiffness.  Gastrointestinal: Negative for abdominal pain, bowel incontinence, diarrhea and excessive appetite.  Genitourinary: Negative for decreased libido, genital sores and incomplete emptying.  Neurological: Negative for brief paralysis, focal weakness, headaches and loss of balance.  Psychiatric/Behavioral: Negative for altered mental status, depression and suicidal ideas.  Allergic/Immunologic: Negative for HIV exposure and persistent infections.    EKGs/Labs/Other Studies Reviewed:    The following studies were reviewed today:   EKG:  The ekg ordered today demonstrates    Present his event monitor. Sinus rhythm with first degree AV block Nocturnal bradycardia No afib, though baseline artifact limits interpretation at times    Recent Labs: 06/30/2020: Hemoglobin 11.6; Platelets 253 07/01/2020: ALT 15 07/02/2020: BUN 12; Creatinine, Ser 0.62; Potassium 4.7; Sodium 131; TSH 4.068  Recent Lipid Panel    Component Value Date/Time   CHOL 168 07/01/2020 0630   TRIG 41 07/01/2020 0630   HDL 73 07/01/2020 0630   CHOLHDL 2.3 07/01/2020 0630   VLDL 8 07/01/2020 0630   LDLCALC 87 07/01/2020 0630    Physical Exam:    VS:  BP (!) 134/50 (BP Location: Left Arm, Patient Position: Sitting, Cuff Size: Normal)   Pulse (!) 56   Ht 5\' 1"  (1.549 m)   Wt 130 lb (59 kg) Comment: Verbal  SpO2 96%   BMI 24.56  kg/m     Wt Readings from Last 3 Encounters:  08/28/20 130 lb (59 kg)  08/23/20 130 lb (59 kg)  07/24/20 131 lb 6.4 oz (59.6 kg)     GEN: Well nourished, well developed in no acute distress HEENT: Normal NECK: No JVD; No carotid bruits LYMPHATICS: No lymphadenopathy CARDIAC: S1S2 noted,RRR, no murmurs, rubs, gallops RESPIRATORY:  Clear to auscultation without rales, wheezing or rhonchi  ABDOMEN: Soft, non-tender, non-distended, +bowel sounds, no guarding. EXTREMITIES: No edema, No cyanosis, no clubbing MUSCULOSKELETAL:  No deformity  SKIN: Warm and dry NEUROLOGIC:  Alert and oriented x 3, non-focal PSYCHIATRIC:  Normal affect, good insight  ASSESSMENT:    1. Primary hypertension   2. TIA (transient ischemic attack)   3. PAF (paroxysmal atrial fibrillation) (HCC)    PLAN:     Based on my personal review day 12 (8/27 at 2:37am) her event monitor did show slow atrial fibrillation transiently.  We discussed this in the office again.  She declines any use of anticoagulation.  She prefers to stay on aspirin and atorvastatin.  Her blood pressure deceptively in the office.  We did talk about fall safety as she had recently had fallen at home picking up a  ball pen.  She is concerned that her blood pressure cuff may not be working appropriately as she had had 1 blood pressure reading was in the 200s and then she minutes after that it was 140 systolic.  I have asked the patient to bring her blood pressure cuff to her next visit.  The patient is in agreement with the above plan. The patient left the office in stable condition.  The patient will follow up in   Medication Adjustments/Labs and Tests Ordered: Current medicines are reviewed at length with the patient today.  Concerns regarding medicines are outlined above.  No orders of the defined types were placed in this encounter.  No orders of the defined types were placed in this encounter.   Patient Instructions  Medication Instructions:  No medication changes. *If you need a refill on your cardiac medications before your next appointment, please call your pharmacy*   Lab Work: None ordered If you have labs (blood work) drawn today and your tests are completely normal, you will receive your results only by: Marland Kitchen MyChart Message (if you have MyChart) OR . A paper copy in the mail If you have any lab test that is abnormal or we need to change your treatment, we will call you to review the results.   Testing/Procedures: None ordered   Follow-Up: At [facility], you and your health needs are our priority.  As part of our continuing mission to provide you with exceptional heart care, we have created designated Provider Care Teams.  These Care Teams include your primary Cardiologist (physician) and Advanced Practice Providers (APPs -  Physician Assistants and Nurse Practitioners) who all work together to provide you with the care you need, when you need it.  We recommend signing up for the patient portal called "MyChart".  Sign up information is provided on this After Visit Summary.  MyChart is used to connect with patients for Virtual Visits (Telemedicine).  Patients are able to view  lab/test results, encounter notes, upcoming appointments, etc.  Non-urgent messages can be sent to your provider as well.   To learn more about what you can do with MyChart, go to [email or web address].    Your next appointment:   6 month(s)  The format for your next  appointment:   In Person  Provider:   Thomasene Ripple, DO   Other Instructions NA    Adopting a Healthy Lifestyle.  Know what a healthy weight is for you (roughly BMI <25) and aim to maintain this   Aim for 7+ servings of fruits and vegetables daily   65-80+ fluid ounces of water or unsweet tea for healthy kidneys   Limit to max 1 drink of alcohol per day; avoid smoking/tobacco   Limit animal fats in diet for cholesterol and heart health - choose grass fed whenever available   Avoid highly processed foods, and foods high in saturated/trans fats   Aim for low stress - take time to unwind and care for your mental health   Aim for 150 min of moderate intensity exercise weekly for heart health, and weights twice weekly for bone health   Aim for 7-9 hours of sleep daily   When it comes to diets, agreement about the perfect plan isnt easy to find, even among the experts. Experts at the [facility] [employer] developed an idea known as the Healthy Eating Plate. Just imagine a plate divided into logical, healthy portions.   The emphasis is on diet quality:   Load up on vegetables and fruits - one-half of your plate: Aim for color and variety, and remember that potatoes dont count.   Go for whole grains - one-quarter of your plate: Whole wheat, barley, wheat berries, quinoa, oats, brown rice, and foods made with them. If you want pasta, go with whole wheat pasta.   Protein power - one-quarter of your plate: Fish, chicken, beans, and nuts are all healthy, versatile protein sources. Limit red meat.   The diet, however, does go beyond the plate, offering a few other suggestions.   Use healthy plant oils,  such as olive, canola, soy, corn, sunflower and peanut. Check the labels, and avoid partially hydrogenated oil, which have unhealthy trans fats.   If youre thirsty, drink water. Coffee and tea are good in moderation, but skip sugary drinks and limit milk and dairy products to one or two daily servings.   The type of carbohydrate in the diet is more important than the amount. Some sources of carbohydrates, such as vegetables, fruits, whole grains, and beans-are healthier than others.   Finally, stay active  Signed, Thomasene Ripple, DO  08/28/2020 4:29 PM    [facility]

## 2020-08-29 ENCOUNTER — Ambulatory Visit: Payer: Medicare Other | Admitting: Cardiology

## 2022-01-16 IMAGING — MR MR HEAD W/O CM
14 of 16 series · 35 of 48 positions shown · non-contrast
Comparison: Head CT 06/30/2020

Brain MRI 04/03/2020

CLINICAL DATA: TIA

EXAM:
MRI HEAD WITHOUT CONTRAST
TECHNIQUE: Multiplanar, multiecho pulse sequences of the brain and surrounding
structures were obtained without intravenous contrast.

[Series 5: DWI · axial · 3.0mm · 0.88mm/px · z∈[-18,+123]mm · 5 of 100 slices shown (1 of 4)]
[im 1/100]
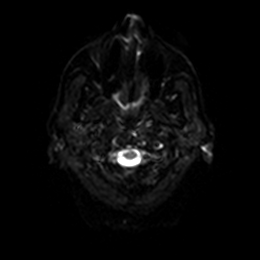
[im 25/100]
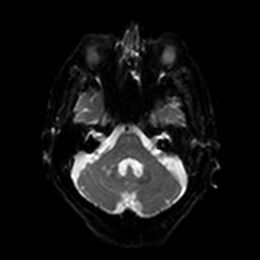
[im 50/100]
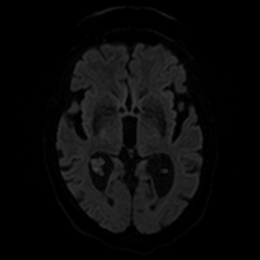
[im 75/100]
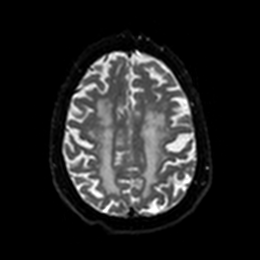
[im 100/100]
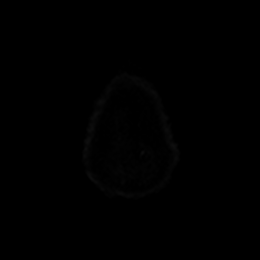

[Series 6: DWI · axial · 3.0mm · 0.88mm/px · z∈[-18,+123]mm · 3 of 50 slices shown (2 of 4)]
[im 1/50]
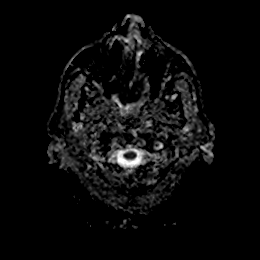
[im 25/50]
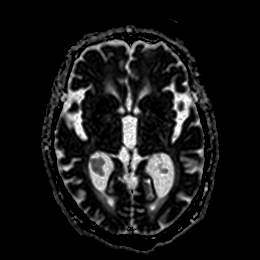
[im 50/50]
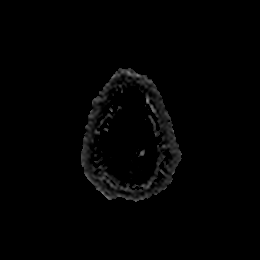

[Series 7: DWI · coronal · 4.0mm · 0.88mm/px · 3 of 70 slices shown (3 of 4)]
[im 1/70]
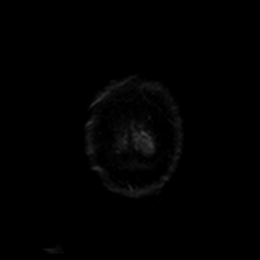
[im 35/70]
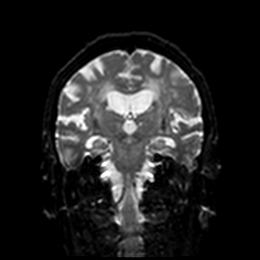
[im 70/70]
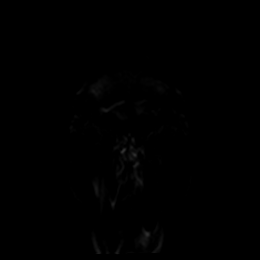

[Series 8: DWI · coronal · 4.0mm · 0.88mm/px · 1 of 35 slices shown (4 of 4)]
[im 1/35]
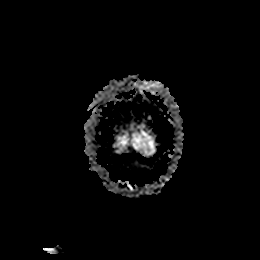

[Series 9: T1 · sagittal · 5.0mm · 0.75mm/px · 1 of 25 slices shown]
[im 1/25]
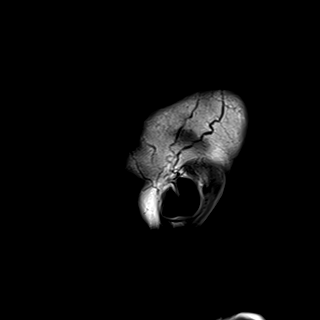

[Series 10: T2 · axial · 5.0mm · 0.72mm/px · 1 of 27 slices shown (1 of 2)]
[im 1/27]
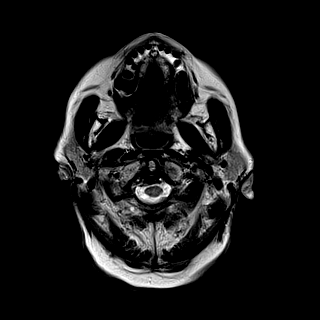

[Series 11: FLAIR · axial · 5.0mm · 0.90mm/px · 1 of 27 slices shown]
[im 1/27]
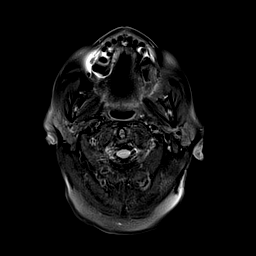

[Series 12: mag_images · axial · 3.0mm · 0.90mm/px · z∈[-25,+122]mm · 2 of 52 slices shown]
[im 1/52]
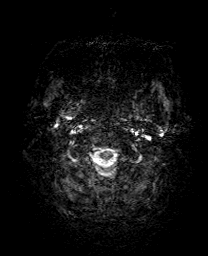
[im 52/52]
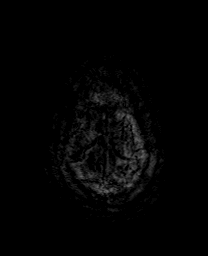

[Series 13: pha_images · axial · 3.0mm · 0.90mm/px · z∈[-25,+122]mm · 2 of 52 slices shown]
[im 1/52]
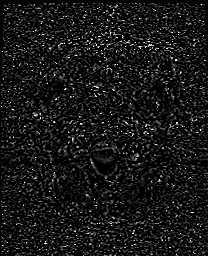
[im 52/52]
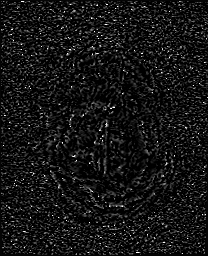

[Series 14: swi_images · axial · 3.0mm · 0.90mm/px · z∈[-25,+122]mm · 2 of 52 slices shown]
[im 1/52]
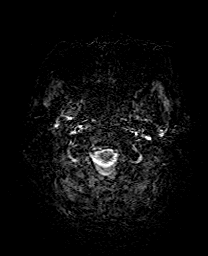
[im 52/52]
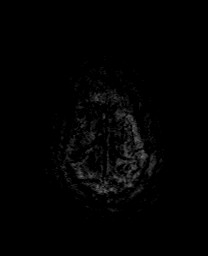

[Series 15: mip_images(sw) · axial · 24.0mm · 0.90mm/px · z∈[-15,+112]mm · 2 of 45 slices shown]
[im 1/45]
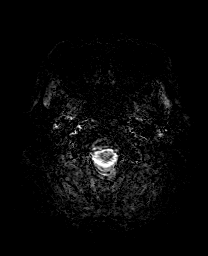
[im 45/45]
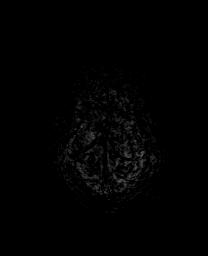

[Series 17: T2 · coronal · 5.0mm · 0.34mm/px · 1 of 30 slices shown (2 of 2)]
[im 1/30]
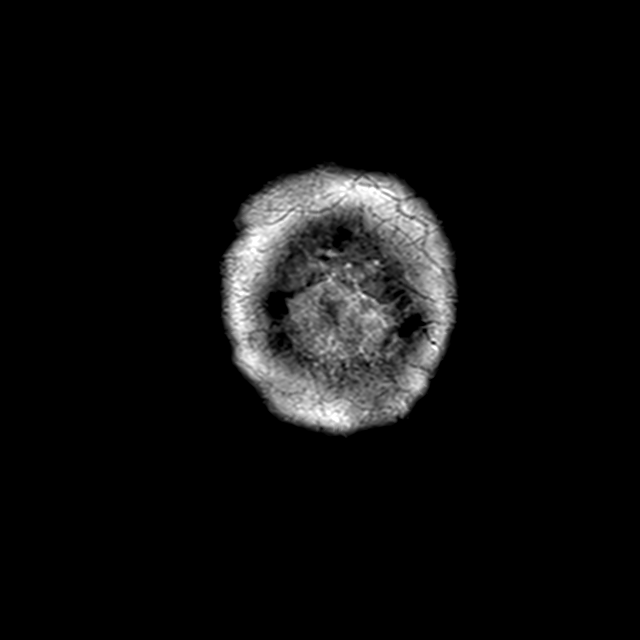

[Series 18: t2_space_dark-fluid_sag_p2_ns-ir · sagittal · 1.0mm · 0.47mm/px · 7 of 159 slices shown]
[im 1/159]
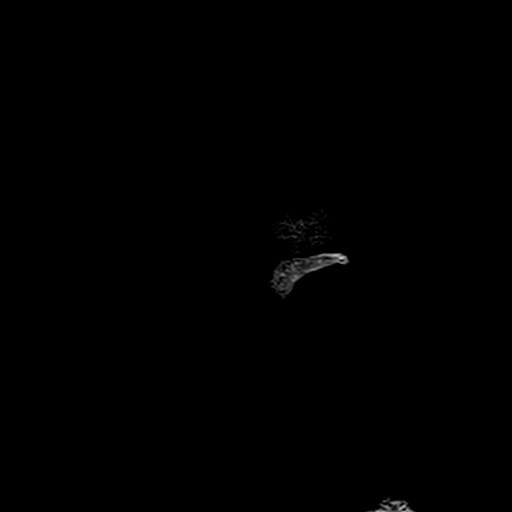
[im 27/159]
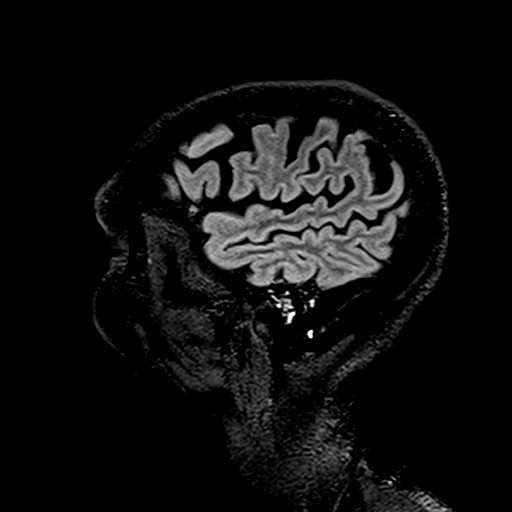
[im 53/159]
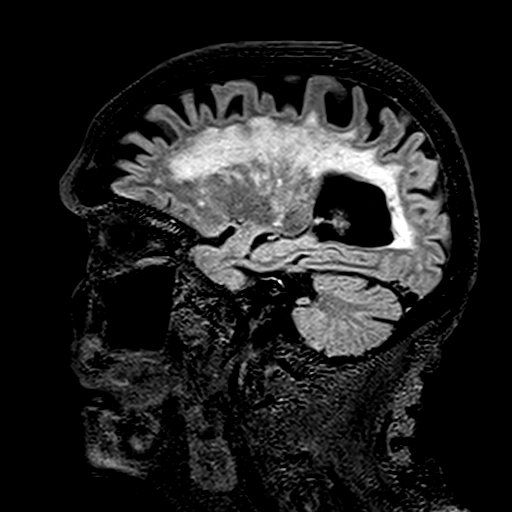
[im 80/159]
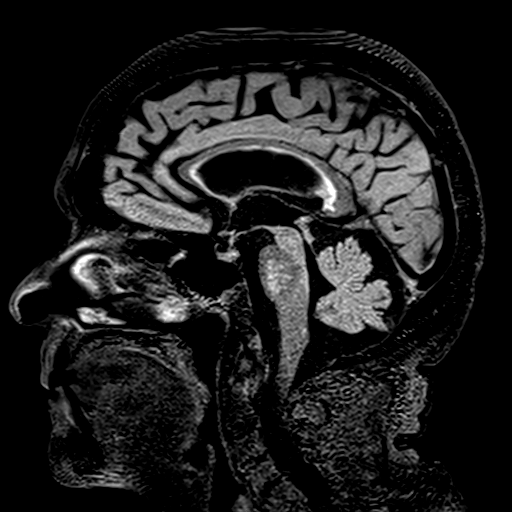
[im 106/159]
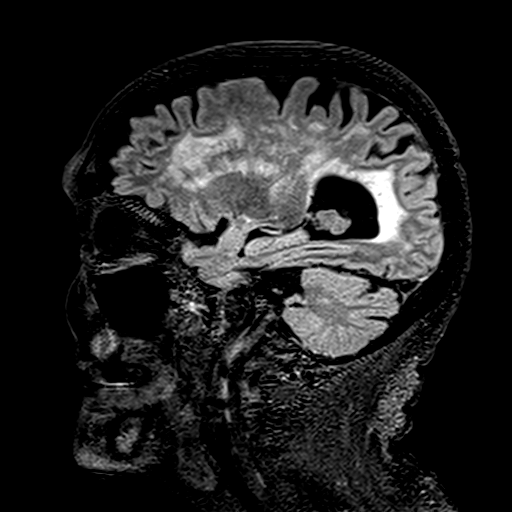
[im 132/159]
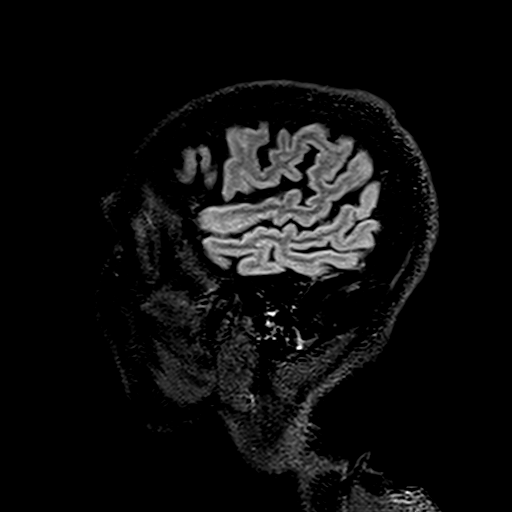
[im 159/159]
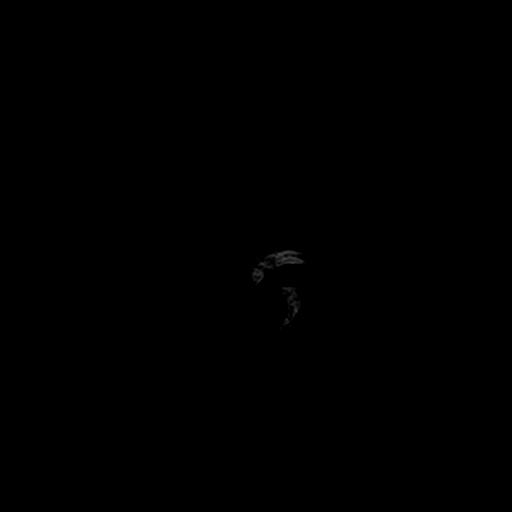

[Series 19: t2_space_dark-fluid_sag_p2_ns-ir_mpr_ axial · axial · 1.0mm · 0.45mm/px · z∈[-35,+43]mm · 4 of 164 slices shown]
[im 1/164]
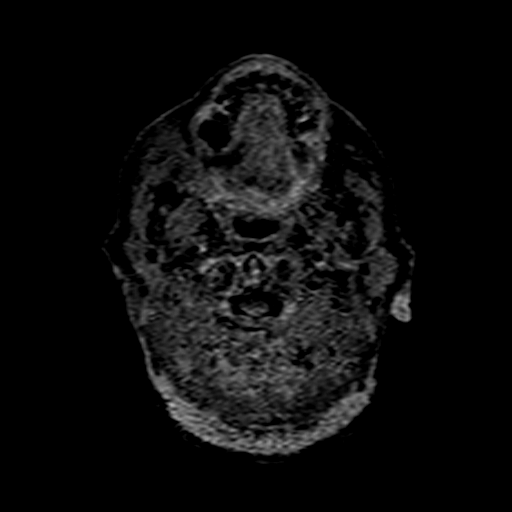
[im 28/164]
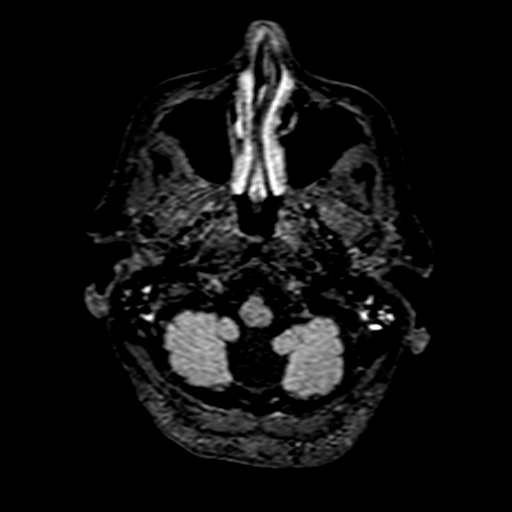
[im 55/164]
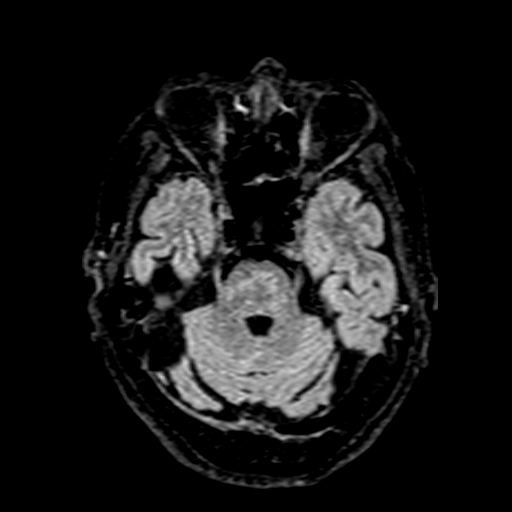
[im 82/164]
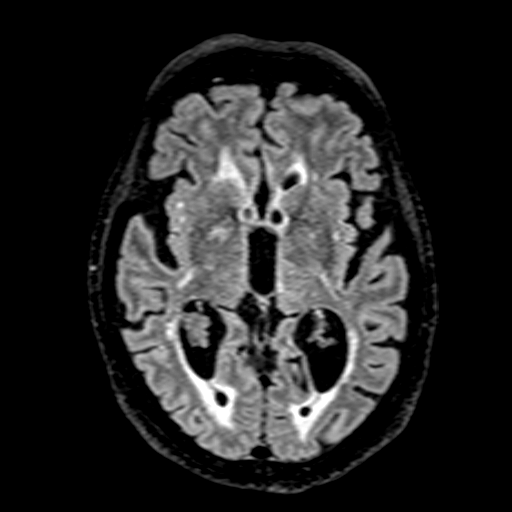

[35 of 48 positions shown; findings below may reference images not displayed]

FINDINGS: Brain: No acute infarct, acute hemorrhage or extra-axial collection.
Diffuse confluent hyperintense T2-weighted signal within the
periventricular, deep and juxtacortical white matter, most commonly
due to chronic ischemic microangiopathy. There is generalized
atrophy without lobar predilection. No chronic microhemorrhage.
Normal midline structures.

Vascular: Normal flow voids.

Skull and upper cervical spine: Normal marrow signal.

Sinuses/Orbits: Small amount of bilateral mastoid fluid. Orbits are
normal.

Other: None.
IMPRESSION: 1. No acute intracranial abnormality.
2. Advanced chronic ischemic microangiopathy and generalized atrophy
without lobar predilection.

## 2022-06-10 ENCOUNTER — Emergency Department (HOSPITAL_BASED_OUTPATIENT_CLINIC_OR_DEPARTMENT_OTHER): Payer: Medicare Other

## 2022-06-10 ENCOUNTER — Emergency Department (HOSPITAL_BASED_OUTPATIENT_CLINIC_OR_DEPARTMENT_OTHER)
Admission: EM | Admit: 2022-06-10 | Discharge: 2022-06-10 | Disposition: A | Discharge status: Home / Self Care | Payer: Medicare Other | Attending: Emergency Medicine | Admitting: Emergency Medicine

## 2022-06-10 ENCOUNTER — Other Ambulatory Visit: Payer: Self-pay

## 2022-06-10 ENCOUNTER — Encounter (HOSPITAL_BASED_OUTPATIENT_CLINIC_OR_DEPARTMENT_OTHER): Payer: Self-pay | Admitting: Emergency Medicine

## 2022-06-10 DIAGNOSIS — Z79899 Other long term (current) drug therapy: Secondary | ICD-10-CM | POA: Diagnosis not present

## 2022-06-10 DIAGNOSIS — R059 Cough, unspecified: Secondary | ICD-10-CM | POA: Insufficient documentation

## 2022-06-10 DIAGNOSIS — Z91040 Latex allergy status: Secondary | ICD-10-CM | POA: Insufficient documentation

## 2022-06-10 DIAGNOSIS — Z7982 Long term (current) use of aspirin: Secondary | ICD-10-CM | POA: Insufficient documentation

## 2022-06-10 DIAGNOSIS — J441 Chronic obstructive pulmonary disease with (acute) exacerbation: Secondary | ICD-10-CM

## 2022-06-10 DIAGNOSIS — I10 Essential (primary) hypertension: Secondary | ICD-10-CM | POA: Insufficient documentation

## 2022-06-10 MED ORDER — DOXYCYCLINE HYCLATE 100 MG PO CAPS: 100.0000 mg | ORAL_CAPSULE | Freq: Two times a day (BID) | ORAL | 0 refills | Status: DC

## 2022-06-10 MED ORDER — PREDNISONE 20 MG PO TABS: 40.0000 mg | ORAL_TABLET | Freq: Every day | ORAL | 0 refills | Status: DC

## 2022-06-10 MED ORDER — DOXYCYCLINE HYCLATE 100 MG PO CAPS: 100.0000 mg | ORAL_CAPSULE | Freq: Two times a day (BID) | ORAL | 0 refills | Status: AC

## 2022-06-10 MED ORDER — PREDNISONE 20 MG PO TABS: 40.0000 mg | ORAL_TABLET | Freq: Every day | ORAL | 0 refills | Status: AC

## 2022-06-10 NOTE — ED Provider Notes (Signed)
[facility] EMERGENCY DEPARTMENT Provider Note   CSN: [id number] Arrival date & time: 06/10/22  1342     History  Chief Complaint  Patient presents with   Cough    Meghan Kim is a 88 y.o. female with past medical history of hypertension, neuropathy, arthritis, thyroid disease, cerebellar stroke, paroxysmal atrial fibrillation (not on anticoagulation).  Presents to the emergency department with complaint of cough.  Patient reports that she has had a cough over the last 3 to 4 weeks.  Cough is producing yellow mucus.  Patient reports that her cough has been persistent despite taking Mucinex.  Patient denies any known sick contacts.  Denies any fever, chills, rhinorrhea, nasal congestion, shortness of breath, chest pain, palpitations, leg swelling or tenderness, abdominal pain, nausea, vomiting, diarrhea, confusion.  Patient reports previous history of smoking.   Cough Associated symptoms: no chest pain, no chills, no fever, no headaches, no rash, no rhinorrhea and no shortness of breath        Home Medications Prior to Admission medications   Medication Sig Start Date End Date Taking? Authorizing Provider  alendronate (FOSAMAX) 70 MG tablet Take 70 mg by mouth every Friday.  01/12/20   [provider]  aspirin EC 81 MG EC tablet Take 1 tablet (81 mg total) by mouth daily. 04/06/20   Amin, Loura Halt, MD  atorvastatin (LIPITOR) 20 MG tablet Take 1 tablet (20 mg total) by mouth daily. 07/03/20   Lynn Ito, MD  docusate sodium (COLACE) 100 MG capsule Take 100 mg by mouth daily with breakfast.    [provider]  dupilumab (DUPIXENT) 300 MG/2ML prefilled syringe Inject 300 mg into the skin every 14 (fourteen) days. Every other Saturday 02/05/20   [provider]  Fexofenadine HCl (WAL-FEX PO) Take 1 tablet by mouth daily with breakfast.    [provider]  fluticasone (FLONASE) 50 MCG/ACT nasal spray Place 2 sprays into both  nostrils 2 (two) times daily.  12/15/19   [provider]  gabapentin (NEURONTIN) 100 MG capsule Take 300 mg by mouth 2 (two) times daily. 08/01/20   [provider]  gabapentin (NEURONTIN) 300 MG capsule Take 400 mg by mouth daily. 100mg  AM and 300mg  PM 02/08/20 07/01/20  [provider]  GLUCOSAMINE-CHONDROITIN PO Take 1 tablet by mouth daily with breakfast.    [provider]  levothyroxine (SYNTHROID) 50 MCG tablet Take 50 mcg by mouth daily before breakfast.  07/08/17   [provider]  MAGNESIUM PO Take 1 tablet by mouth at bedtime.    [provider]  mirabegron ER (MYRBETRIQ) 25 MG TB24 tablet Take 25 mg by mouth daily with breakfast.  03/20/20   [provider]  Multiple Vitamins-Minerals (MULTIVITAMIN GUMMIES WOMENS) CHEW Chew 2 tablets by mouth daily with breakfast.    [provider]  Omega-3 Fatty Acids (FISH OIL PO) Take 1 capsule by mouth daily with breakfast.    [provider]  polyethylene glycol (MIRALAX / GLYCOLAX) 17 g packet Take 17 g by mouth daily with breakfast. Mix in 4-8 oz water and drink    [provider]  rOPINIRole (REQUIP) 0.25 MG tablet Take 0.25 mg by mouth at bedtime.     [provider]  temazepam (RESTORIL) 15 MG capsule Take 15 mg by mouth at bedtime.  11/11/19   [provider]  valsartan (DIOVAN) 160 MG tablet Take 160 mg by mouth daily. 08/25/20   [provider]  Allergies    Latex; Penicillins; Tetanus toxoid, adsorbed; Sulfamethoxazole-trimethoprim; Adhesive [tape]; and Morphine    Review of Systems   Review of Systems  Constitutional:  Negative for chills and fever.  HENT:  Negative for rhinorrhea.   Eyes:  Negative for visual disturbance.  Respiratory:  Positive for cough. Negative for shortness of breath.   Cardiovascular:  Negative for chest pain.  Gastrointestinal:  Negative for abdominal pain, nausea and vomiting.   Musculoskeletal:  Negative for back pain and neck pain.  Skin:  Negative for color change and rash.  Neurological:  Negative for dizziness, syncope, light-headedness and headaches.  Psychiatric/Behavioral:  Negative for confusion.     Physical Exam Updated Vital Signs BP (!) 149/65 (BP Location: Right Arm)   Pulse 81   Temp 98.2 F (36.8 C) (Oral)   Resp 16   Ht 5\' 1"  (1.549 m)   Wt 58.1 kg   SpO2 98%   BMI 24.19 kg/m  Physical Exam Vitals and nursing note reviewed.  Constitutional:      General: She is not in acute distress.    Appearance: She is not ill-appearing, toxic-appearing or diaphoretic.  HENT:     Head: Normocephalic.  Eyes:     General: No scleral icterus.       Right eye: No discharge.        Left eye: No discharge.  Cardiovascular:     Rate and Rhythm: Normal rate.     Pulses:          Radial pulses are 2+ on the right side and 2+ on the left side.  Pulmonary:     Effort: Pulmonary effort is normal. No tachypnea, bradypnea or respiratory distress.     Breath sounds: Normal breath sounds. No stridor.     Comments: Speaks in full complete sentences without difficulty. Musculoskeletal:     Right lower leg: No edema.     Left lower leg: No edema.  Skin:    General: Skin is warm and dry.  Neurological:     General: No focal deficit present.     Mental Status: She is alert.  Psychiatric:        Behavior: Behavior is cooperative.     ED Results / Procedures / Treatments   Labs (all labs ordered are listed, but only abnormal results are displayed) Labs Reviewed - No data to display  EKG None  Radiology DG Chest 2 View  Result Date: 06/10/2022 CLINICAL DATA:  Cough and chest congestion x2 weeks. EXAM: CHEST - 2 VIEW COMPARISON:  Radiograph Apr 01, 2020. FINDINGS: Normal size heart. Aortic atherosclerosis. Hiatal hernia. Chronic bronchitic lung changes. No focal airspace consolidation. No visible pleural effusion or pneumothorax. Thoracic spondylosis.  Degenerative changes shoulders. IMPRESSION: Chronic bronchitic lung changes without focal airspace consolidation. Small hiatal hernia. Aortic Atherosclerosis (ICD10-I70.0). Electronically Signed   By: Apr 03, 2020 M.D.   On: 06/10/2022 15:00    Procedures Procedures    Medications Ordered in ED Medications - No data to display  ED Course/ Medical Decision Making/ A&P                           Medical Decision Making  Alert 88 year old female no acute distress, nontoxic-appearing.  Presents to the ED with a chief complaint of cough.  Information was obtained from patient and patient's son.  Past medical records were reviewed including previous divider notes, labs, and imaging.  Patient has medical history  as outlined in HPI which complicates her care.  Per chart review patient called her primary care provider on 05/20/2022 for similar reports of cough.  Patient was prescribed a course of Mucinex.  Due to reports of cough there is concern for pneumonia.  Chest x-ray was obtained.  I personally viewed and interpreted patient's chest x-ray.  Imaging shows chronic bronchitic lung changes without focal airspace consolidation.  Patient is afebrile and nontoxic-appearing.  Low suspicion for sepsis at this time.  Did discuss obtaining BMP and CBC however patient refuses at this time.  Patient has no formal diagnosis of COPD however does report that she smoked for many years.  We will treat patient as COPD exacerbation with course of antibiotics and steroids.  Patient to follow-up with PCP for repeat evaluation.  Patient care discussed with attending physician Dr. Wallace Cullens  Based on patient's chief complaint, I considered admission might be necessary, however after reassuring ED workup feel patient is reasonable for discharge.  Discussed results, findings, treatment and follow up. Patient advised of return precautions. Patient verbalized understanding and agreed with plan.  Portions of this note were  generated with Scientist, clinical (histocompatibility and immunogenetics). Dictation errors may occur despite best attempts at proofreading.         Final Clinical Impression(s) / ED Diagnoses Final diagnoses:  None    Rx / DC Orders ED Discharge Orders     None         Haskel Schroeder, PA-C 06/10/22 1633    Edwin Dada P, DO 06/13/22 0710

## 2022-06-10 NOTE — ED Triage Notes (Signed)
Pt has cough and chest congestion x 2 weeks.  No known fever.

## 2022-06-10 NOTE — Discharge Instructions (Addendum)
You came to the emergency department today to be evaluated for your cough.  Your chest x-ray did not show any signs of pneumonia.  There were changes consistent with COPD on your chest x-ray.  We will treat you for a COPD exacerbation with a course of antibiotics and steroids.  Please follow-up with your primary care doctor for repeat evaluation.  I have given you a prescription for steroids today.  Some common side effects include feelings of extra energy, feeling warm, increased appetite, and stomach upset.  If you are diabetic your sugars may run higher than usual.   Get help right away if: You have shortness of breath while you are resting. You have shortness of breath that prevents you from: Being able to talk. Performing your usual physical activities. You have chest pain lasting longer than 5 minutes. Your skin color is more blue (cyanotic) than usual. You measure low oxygen saturations for longer than 5 minutes with a pulse oximeter. You have a fever. You feel too tired to breathe normally.
